# Patient Record
Sex: Male | Born: 1960 | Race: White | Hispanic: No | Marital: Single | Smoking: Former smoker
Health system: Southern US, Community
[De-identification: ages and names within clinical notes are randomized; demographics above are authoritative.]

## PROBLEM LIST (undated history)

## (undated) DIAGNOSIS — Z1589 Genetic susceptibility to other disease: Secondary | ICD-10-CM

## (undated) HISTORY — DX: Genetic susceptibility to other disease: Z15.89

---

## 2018-09-02 DIAGNOSIS — F259 Schizoaffective disorder, unspecified: Secondary | ICD-10-CM | POA: Insufficient documentation

## 2018-09-02 DIAGNOSIS — E785 Hyperlipidemia, unspecified: Secondary | ICD-10-CM | POA: Insufficient documentation

## 2018-09-02 DIAGNOSIS — E559 Vitamin D deficiency, unspecified: Secondary | ICD-10-CM | POA: Insufficient documentation

## 2018-09-02 DIAGNOSIS — E669 Obesity, unspecified: Secondary | ICD-10-CM | POA: Insufficient documentation

## 2020-07-27 ENCOUNTER — Encounter: Payer: Self-pay | Admitting: Gastroenterology

## 2020-08-03 ENCOUNTER — Encounter: Payer: Self-pay | Admitting: Family Medicine

## 2020-09-22 ENCOUNTER — Other Ambulatory Visit: Payer: Self-pay

## 2020-09-22 ENCOUNTER — Emergency Department: Payer: Medicare Other

## 2020-09-22 ENCOUNTER — Emergency Department
Admission: EM | Admit: 2020-09-22 | Discharge: 2020-09-22 | Disposition: A | Discharge status: Home / Self Care | Payer: Medicare Other | Attending: Emergency Medicine | Admitting: Emergency Medicine

## 2020-09-22 DIAGNOSIS — F419 Anxiety disorder, unspecified: Secondary | ICD-10-CM | POA: Diagnosis not present

## 2020-09-22 DIAGNOSIS — R079 Chest pain, unspecified: Secondary | ICD-10-CM

## 2020-09-22 DIAGNOSIS — R0602 Shortness of breath: Secondary | ICD-10-CM | POA: Insufficient documentation

## 2020-09-22 DIAGNOSIS — R0789 Other chest pain: Secondary | ICD-10-CM | POA: Insufficient documentation

## 2020-09-22 LAB — CBC
HCT: 42.4 % (ref 39.0–52.0)
Hemoglobin: 14.4 g/dL (ref 13.0–17.0)
MCH: 29.5 pg (ref 26.0–34.0)
MCHC: 34 g/dL (ref 30.0–36.0)
MCV: 86.9 fL (ref 80.0–100.0)
Platelets: 233 10*3/uL (ref 150–400)
RBC: 4.88 MIL/uL (ref 4.22–5.81)
RDW: 13.2 % (ref 11.5–15.5)
WBC: 5.2 10*3/uL (ref 4.0–10.5)
nRBC: 0 % (ref 0.0–0.2)

## 2020-09-22 LAB — BASIC METABOLIC PANEL
Anion gap: 6 (ref 5–15)
BUN: 18 mg/dL (ref 6–20)
CO2: 29 mmol/L (ref 22–32)
Calcium: 9.2 mg/dL (ref 8.9–10.3)
Chloride: 103 mmol/L (ref 98–111)
Creatinine, Ser: 0.91 mg/dL (ref 0.61–1.24)
GFR, Estimated: >60 mL/min (ref >60)
Glucose, Bld: 108 mg/dL — ABNORMAL HIGH (ref 70–99)
Potassium: 4.2 mmol/L (ref 3.5–5.1)
Sodium: 138 mmol/L (ref 135–145)

## 2020-09-22 LAB — TROPONIN I (HIGH SENSITIVITY): Troponin I (High Sensitivity): 3 ng/L (ref <18)

## 2020-09-22 NOTE — ED Notes (Signed)
Called facility/group home. They will send someone to pick him up now.

## 2020-09-22 NOTE — ED Notes (Signed)
Pt has legal guardian. Unable to sign for himself.

## 2020-09-22 NOTE — ED Triage Notes (Signed)
Pt here with CP and SOB. Pt has hx of anxiety. 154/101,77,95% 121-cbg. Pt feels better upon arrival to ED. Pt states it's more pressure than pain. Pt states pain is in the center and radiates down left arm.

## 2020-09-22 NOTE — ED Provider Notes (Signed)
[facility] Emergency Department Provider Note  ____________________________________________   Event Date/Time   First MD Initiated Contact with Patient 09/22/20 1216     (approximate)  I have reviewed the triage vital signs and the nursing notes.   HISTORY  Chief Complaint Chest Pain   HPI Tyrone Little is a 60 y.o. male with reported history of obesity and anxiety who presents for assessment of some left-sided chest pressure rating to his left arm associate with some shortness of breath he states began around 9 AM..  Patient states he was feeling very anxious and thought it was likely panic attack but was unsure as to when to get his heart checked out.  Patient states he has had many panic attacks before but does not usually have chest pressure or shortness of breath with it.  He states his symptoms resolved prior to presentation to the emergency room.  He states he was just having a bad day and is not sure if he has any specific thing that precipitated a panic attack.  He denies any SI or HI.  He denies any fevers, chills, headache, earache, sore throat, nausea, vomiting, diarrhea, dysuria, rash or recent falls or injuries.  He denies tobacco abuse EtOH use or illicit drug use.  Denies any other acute concerns at this time.      No past medical history on file.  There are no problems to display for this patient.     Prior to Admission medications   Not on File    Allergies Patient has no allergy information on record.  No family history on file.  Social History    Review of Systems  Review of Systems  Constitutional:  Negative for chills and fever.  HENT:  Negative for sore throat.   Eyes:  Negative for pain.  Respiratory:  Positive for shortness of breath. Negative for cough and stridor.   Cardiovascular:  Positive for chest pain.  Gastrointestinal:  Negative for vomiting.  Skin:  Negative for rash.  Neurological:  Negative for seizures,  loss of consciousness and headaches.  Psychiatric/Behavioral:  Negative for suicidal ideas. The patient is nervous/anxious.   All other systems reviewed and are negative.    ____________________________________________   PHYSICAL EXAM:  VITAL SIGNS: ED Triage Vitals  Enc Vitals Group     BP 09/22/20 1211 139/87     Pulse Rate 09/22/20 1211 67     Resp 09/22/20 1211 18     Temp 09/22/20 1211 98.3 F (36.8 C)     Temp Source 09/22/20 1211 Oral     SpO2 09/22/20 1211 96 %     Weight 09/22/20 1209 (!) 344 lb (156 kg)     Height 09/22/20 1209 5\' 11"  (1.803 m)     Head Circumference --      Peak Flow --      Pain Score 09/22/20 1209 9     Pain Loc --      Pain Edu? --      Excl. in GC? --    Vitals:   09/22/20 1211 09/22/20 1315  BP: 139/87 (!) 130/97  Pulse: 67 77  Resp: 18 (!) 25  Temp: 98.3 F (36.8 C)   SpO2: 96% 96%   Physical Exam Vitals and nursing note reviewed.  Constitutional:      Appearance: He is well-developed. He is obese.  HENT:     Head: Normocephalic and atraumatic.     Right Ear: External ear normal.  Left Ear: External ear normal.     Nose: Nose normal.  Eyes:     Conjunctiva/sclera: Conjunctivae normal.  Cardiovascular:     Rate and Rhythm: Normal rate and regular rhythm.     Heart sounds: No murmur heard. Pulmonary:     Effort: Pulmonary effort is normal. No respiratory distress.     Breath sounds: Normal breath sounds.  Abdominal:     Palpations: Abdomen is soft.     Tenderness: There is no abdominal tenderness.  Musculoskeletal:     Cervical back: Neck supple.     Right lower leg: No edema.     Left lower leg: No edema.  Skin:    General: Skin is warm and dry.     Capillary Refill: Capillary refill takes less than 2 seconds.  Neurological:     Mental Status: He is alert and oriented to person, place, and time.  Psychiatric:        Mood and Affect: Mood normal.     ____________________________________________   LABS (all  labs ordered are listed, but only abnormal results are displayed)  Labs Reviewed  BASIC METABOLIC PANEL - Abnormal; Notable for the following components:      Result Value   Glucose, Bld 108 (*)    All other components within normal limits  CBC  TROPONIN I (HIGH SENSITIVITY)   ____________________________________________  EKG  Sinus rhythm with a ventricular rate of 67, normal axis, unremarkable intervals without evidence of clear acute ischemia or significant Arrhythmia. ____________________________________________  RADIOLOGY  ED MD interpretation:    Official radiology report(s): DG Chest 2 View  Result Date: 09/22/2020 CLINICAL DATA:  Pt here with CP and SOB. Pt has hx of anxiety. Pt feels better upon arrival to ED. Pt states it's more pressure than pain. Pt states pain is in the center and radiates down left arm. Pt states he "thinks he is a diabetic but doesn't do anything for it." EXAM: CHEST - 2 VIEW COMPARISON:  None. FINDINGS: Cardiac silhouette normal in size. Normal mediastinal and hilar contours. Linear opacity at the right lung base consistent with atelectasis. Lungs otherwise clear. No pleural effusion or pneumothorax. Skeletal structures are intact. IMPRESSION: No active cardiopulmonary disease. Electronically Signed   By: Amie Portland M.D.   On: 09/22/2020 13:04    ____________________________________________   PROCEDURES  Procedure(s) performed (including Critical Care):  .1-3 Lead EKG Interpretation  Date/Time: 09/22/2020 1:28 PM Performed by: Gilles Chiquito, MD Authorized by: Gilles Chiquito, MD     Interpretation: normal     ECG rate assessment: normal     Rhythm: sinus rhythm     Ectopy: none     Conduction: normal     ____________________________________________   INITIAL IMPRESSION / ASSESSMENT AND PLAN / ED COURSE      Patient presents with above-stated history exam for assessment of an episode of chest pressure associate with some  shortness of breath that occurred earlier this morning and resolved prior to presentation.  On arrival he is afebrile and hemodynamically stable.  Current differential includes ACS, arrhythmia, PE, pneumonia, thorax, anemia and metabolic derangements  Overall it is very low sufficient for PE given sudden onset of symptoms and resolution without any interventions.  Patient has no clear risk factors and is not tachycardic tachypneic or hypoxic.  Chest x-ray has no evidence of pneumonia or pneumothorax or other clear acute intrathoracic process.  ECG and non-elevated troponin obtained greater than 2 hours after symptom onset are  not consistent with ACS.  CBC and BMP are unremarkable for evidence of acute anemia or significant metabolic derangements.  Given stable vitals with otherwise reassuring exam work-up I think patient stable for close outpatient follow-up.  Discharged stable condition.  Strict return precautions advised discussed.   ____________________________________________   FINAL CLINICAL IMPRESSION(S) / ED DIAGNOSES  Final diagnoses:  Chest pain, unspecified type    Medications - No data to display   ED Discharge Orders     None        Note:  This document was prepared using Dragon voice recognition software and may include unintentional dictation errors.    Gilles Chiquito, MD 09/22/20 1329

## 2021-05-09 ENCOUNTER — Ambulatory Visit (INDEPENDENT_AMBULATORY_CARE_PROVIDER_SITE_OTHER): Payer: Medicare Other | Admitting: Nurse Practitioner

## 2021-05-09 ENCOUNTER — Encounter: Payer: Self-pay | Admitting: Nurse Practitioner

## 2021-05-09 ENCOUNTER — Other Ambulatory Visit: Payer: Self-pay

## 2021-05-09 VITALS — BP 130/80 | HR 90 | Temp 97.8°F | Resp 18 | Wt 237.9 lb

## 2021-05-09 DIAGNOSIS — Z7689 Persons encountering health services in other specified circumstances: Secondary | ICD-10-CM

## 2021-05-09 DIAGNOSIS — Z13220 Encounter for screening for lipoid disorders: Secondary | ICD-10-CM | POA: Diagnosis not present

## 2021-05-09 DIAGNOSIS — E559 Vitamin D deficiency, unspecified: Secondary | ICD-10-CM | POA: Diagnosis not present

## 2021-05-09 DIAGNOSIS — F259 Schizoaffective disorder, unspecified: Secondary | ICD-10-CM

## 2021-05-09 DIAGNOSIS — Z130 Encounter for screening for diseases of the blood and blood-forming organs and certain disorders involving the immune mechanism: Secondary | ICD-10-CM

## 2021-05-09 DIAGNOSIS — Z79899 Other long term (current) drug therapy: Secondary | ICD-10-CM

## 2021-05-09 DIAGNOSIS — E785 Hyperlipidemia, unspecified: Secondary | ICD-10-CM | POA: Diagnosis not present

## 2021-05-09 DIAGNOSIS — Z0000 Encounter for general adult medical examination without abnormal findings: Secondary | ICD-10-CM

## 2021-05-09 NOTE — Progress Notes (Deleted)
Name: Tyrone Little   MRN: [id number]    DOB: [birth year]   Date:05/09/2021       Progress Note  Subjective  Chief Complaint  Chief Complaint  Patient presents with   Establish Care    HPI  Patient presents for annual CPE and to establish care  Establish care: Last physical was a year ago, no abnormal findings per staff of assisted living facility. He does have history of schizoaffective disorder, development disorder and hyperlipidemia. He currently gets mental health care at [facility] in [place].   Schizoaffective disorder: Currently taking risperidone 2 mg BID, Seroquel 200 mg at bed time, hydroxyzine 50 mg TID PRN, and fluphenazine 5 mg at bedtime. He says he has been doing well and staff agrees.   Hyperlipidemia: He is currently taking simvastatin 40 mg at bedtime.  He denies any myalgia.  I have no results from last lipid panel will get labs today.   Vitamin D deficiency: He is currently taking vitamin d supplement. I have no records of what his last vitamin d was will get labs today.   IPSS Questionnaire (AUA-7): Over the past month   1)  How often have you had a sensation of not emptying your bladder completely after you finish urinating?  0 - Not at all  2)  How often have you had to urinate again less than two hours after you finished urinating? 0 - Not at all  3)  How often have you found you stopped and started again several times when you urinated?  0 - Not at all  4) How difficult have you found it to postpone urination?  0 - Not at all  5) How often have you had a weak urinary stream?  0 - Not at all  6) How often have you had to push or strain to begin urination?  0 - Not at all  7) How many times did you most typically get up to urinate from the time you went to bed until the time you got up in the morning?  0 - None  Total score:  0-7 mildly symptomatic   8-19 moderately symptomatic   20-35 severely symptomatic     Diet: well balanced Exercise: walks  everyday for thirty minutes  Depression: phq 9 is negative Depression screen [facility] 2/9 05/09/2021 05/09/2021  Decreased Interest 0 0  Down, Depressed, Hopeless 0 0  PHQ - 2 Score 0 0  Altered sleeping 0 -  Tired, decreased energy 0 -  Change in appetite 0 -  Feeling bad or failure about yourself  0 -  Trouble concentrating 0 -  Moving slowly or fidgety/restless 0 -  Suicidal thoughts 0 -  PHQ-9 Score 0 -  Difficult doing work/chores Not difficult at all -     Hypertension:  BP Readings from Last 3 Encounters:  05/09/21 130/80  09/22/20 (!) 148/96    Obesity: Wt Readings from Last 3 Encounters:  05/09/21 237 lb 14.4 oz (107.9 kg)  09/22/20 (!) 344 lb (156 kg)   BMI Readings from Last 3 Encounters:  05/09/21 33.18 kg/m  09/22/20 47.98 kg/m     Lipids:  No results found for: CHOL No results found for: HDL No results found for: LDLCALC No results found for: TRIG No results found for: CHOLHDL No results found for: LDLDIRECT Glucose:  Glucose, Bld  Date Value Ref Range Status  09/22/2020 108 (H) 70 - 99 mg/dL Final    Comment:  Glucose reference range applies only to samples taken after fasting for at least 8 hours.    Flowsheet Row [facility]  AUDIT-C Score 1        Single STD testing and prevention (HIV/chl/gon/syphilis): declined Hep C: declined  Skin cancer: Discussed monitoring for atypical lesions Colorectal cancer: He has an appointment with GI in the next few weeks. He was recently seen in emergency room for incontinence of stool.  They referred him to GI.  Prostate cancer: no concerns or symptoms No results found for: PSA   Lung cancer:   Low Dose CT Chest recommended if Age 4-80 years, 30 pack-year currently smoking OR have quit w/in 15years. Patient does not qualify.   AAA:  The USPSTF recommends one-time screening with ultrasonography in men ages 67 to 79 years who have ever smoked ECG:   09/22/2020  Vaccines:  HPV: up to at age 46 , ask insurance if age between 88-45  Shingrix: 35-59 yo and ask insurance if covered when patient above 5 yo Pneumonia:  educated and discussed with patient. Flu:  educated and discussed with patient.  Advanced Care Planning: A voluntary discussion about advance care planning including the explanation and discussion of advance directives.  Discussed health care proxy and Living will, and the patient was able to identify a health care proxy as brother.  Patient does not have a living will at present time. If patient does have living will, I have requested they bring this to the clinic to be scanned in to their chart.  Patient Active Problem List   Diagnosis Date Noted   Hyperlipidemia 09/02/2018   Obesity 09/02/2018   Schizoaffective disorder (HCC) 09/02/2018   Vitamin D deficiency 09/02/2018    No family history on file.  Social History   Socioeconomic History   Marital status: Single    Spouse name: Not on file   Number of children: Not on file   Years of education: Not on file   Highest education level: Not on file  Occupational History   Not on file  Tobacco Use   Smoking status: Former    Types: Cigarettes    Quit date: 2007    Years since quitting: 16.1   Smokeless tobacco: Never  Vaping Use   Vaping Use: Never used  Substance and Sexual Activity   Alcohol use: Never   Drug use: Never   Sexual activity: Never  Other Topics Concern   Not on file  Social History Narrative   [facility] [address], [place], [place]  [phone]   Social Determinants of Health   Financial Resource Strain: Low Risk    Difficulty of Paying Living Expenses: Not hard at all  Food Insecurity: No Food Insecurity   Worried About Programme researcher, broadcasting/film/video in the Last Year: Never true   Ran Out of Food in the Last Year: Never true  Transportation Needs: No Transportation Needs   Lack of Transportation (Medical): No   Lack of  Transportation (Non-Medical): No  Physical Activity: Sufficiently Active   Days of Exercise per Week: 7 days   Minutes of Exercise per Session: 30 min  Stress: No Stress Concern Present   Feeling of Stress : Not at all  Social Connections: Moderately Integrated   Frequency of Communication with Friends and Family: More than three times a week   Frequency of Social Gatherings with Friends and Family: Not on file   Attends Religious  Services: More than 4 times per year   Active Member of Clubs or Organizations: Yes   Attends BankerClub or Organization Meetings: Not on file   Marital Status: Never married  Intimate Partner Violence: Not At Risk   Fear of Current or Ex-Partner: No   Emotionally Abused: No   Physically Abused: No   Sexually Abused: No     Current Outpatient Medications:    acetaminophen (TYLENOL) 500 MG tablet, Take 500 mg by mouth every 6 (six) hours as needed., Disp: , Rfl:    cetirizine (ZYRTEC) 10 MG tablet, Take by mouth., Disp: , Rfl:    Cholecalciferol (VITAMIN D3) 1.25 MG (50000 UT) CAPS, Take by mouth., Disp: , Rfl:    fluPHENAZine (PROLIXIN) 5 MG tablet, Take 5 mg by mouth at bedtime., Disp: , Rfl:    hydrOXYzine (ATARAX) 50 MG tablet, Take 50 mg by mouth 3 (three) times daily., Disp: , Rfl:    ibuprofen (ADVIL) 600 MG tablet, Take 600 mg by mouth every 6 (six) hours as needed., Disp: , Rfl:    Multiple Vitamin (MULTI-VITAMIN) tablet, Take 1 tablet by mouth daily., Disp: , Rfl:    omega-3 acid ethyl esters (LOVAZA) 1 g capsule, Take by mouth 2 (two) times daily., Disp: , Rfl:    pioglitazone (ACTOS) 30 MG tablet, Take 30 mg by mouth daily., Disp: , Rfl:    QUEtiapine (SEROQUEL) 200 MG tablet, Take 200 mg by mouth at bedtime., Disp: , Rfl:    risperiDONE (RISPERDAL) 2 MG tablet, Take 2 mg by mouth 2 (two) times daily., Disp: , Rfl:    simvastatin (ZOCOR) 40 MG tablet, Take 40 mg by mouth at bedtime., Disp: , Rfl:    tamsulosin (FLOMAX) 0.4 MG CAPS capsule, Take 0.4 mg  by mouth daily., Disp: , Rfl:   Not on File   ROS  Constitutional: Negative for fever or weight change.  Respiratory: Negative for cough and shortness of breath.   Cardiovascular: Negative for chest pain or palpitations.  Gastrointestinal: Negative for abdominal pain, has been incontinent of stool.  Musculoskeletal: Negative for gait problem or joint swelling.  Skin: Negative for rash.  Neurological: Negative for dizziness or headache.  No other specific complaints in a complete review of systems (except as listed in HPI above).    Objective  Vitals:   05/09/21 1050  BP: 130/80  Pulse: 90  Resp: 18  Temp: 97.8 F (36.6 C)  TempSrc: Oral  SpO2: 96%  Weight: 237 lb 14.4 oz (107.9 kg)    Body mass index is 33.18 kg/m.  Physical Exam  Constitutional: Patient appears well-developed and well-nourished. No distress.  HENT: Head: Normocephalic and atraumatic. Ears: B TMs ok, hearing normal (whisper test) no erythema or effusion; Nose: Nose normal. Mouth/Throat: not done Eyes: Conjunctivae and EOM are normal. Pupils are equal, round, and reactive to light. No scleral icterus.  Neck: Normal range of motion. Neck supple. No JVD present. No thyromegaly present.  Cardiovascular: Normal rate, regular rhythm and normal heart sounds.  No murmur heard. No BLE edema. Pulmonary/Chest: Effort normal and breath sounds normal. No respiratory distress. Abdominal: Soft. Bowel sounds are normal, no distension. Tenderness all over. no masses Musculoskeletal: Normal range of motion, no joint effusions. No gross deformities Neurological: he is alert and oriented to person, place, and time. No cranial nerve deficit. Coordination, balance, strength, speech and gait are normal.  Skin: Skin is warm and dry. No rash noted. No erythema.  Psychiatric: Patient has a  normal mood and affect. behavior is normal. Judgment and thought content normal.   No results found for this or any previous visit (from the  past 2160 hour(s)).   Fall Risk: Fall Risk  05/09/2021  Falls in the past year? 1  Number falls in past yr: 0  Injury with Fall? 1  Risk for fall due to : History of fall(s)  Follow up Falls evaluation completed     Functional Status Survey: Is the patient deaf or have difficulty hearing?: No Does the patient have difficulty seeing, even when wearing glasses/contacts?: No Does the patient have difficulty concentrating, remembering, or making decisions?: Yes Does the patient have difficulty walking or climbing stairs?: Yes Does the patient have difficulty dressing or bathing?: No Does the patient have difficulty doing errands alone such as visiting a doctor's office or shopping?: Yes    Assessment & Plan  1. Annual physical exam -continue to stay physically active -has appointment with GI to discuss bowel changes, he is due for colonoscopy - Lipid panel - CBC with Differential/Platelet - COMPLETE METABOLIC PANEL WITH GFR - VITAMIN D 25 Hydroxy (Vit-D Deficiency, Fractures)  2. Screening for cholesterol level  - Lipid panel  3. Vitamin D deficiency  - VITAMIN D 25 Hydroxy (Vit-D Deficiency, Fractures)  4. Hyperlipidemia, unspecified hyperlipidemia type  - Lipid panel - COMPLETE METABOLIC PANEL WITH GFR  5. Schizoaffective disorder, unspecified type (HCC) -continue care at mental health facility  6. Screening for deficiency anemia  - CBC with Differential/Platelet  7. High risk medication use  - CBC with Differential/Platelet - COMPLETE METABOLIC PANEL WITH GFR  8. Encounter to establish care -cpe in one year -get records from previous provider   -Prostate cancer screening and PSA options (with potential risks and benefits of testing vs not testing) were discussed along with recent recs/guidelines. -USPSTF grade A and B recommendations reviewed with patient; age-appropriate recommendations, preventive care, screening tests, etc discussed and encouraged;  healthy living encouraged; see AVS for patient education given to patient -Discussed importance of 150 minutes of physical activity weekly, eat two servings of fish weekly, eat one serving of tree nuts ( cashews, pistachios, pecans, almonds.Marland Kitchen) every other day, eat 6 servings of fruit/vegetables daily and drink plenty of water and avoid sweet beverages.

## 2021-05-10 LAB — COMPLETE METABOLIC PANEL WITH GFR
AG Ratio: 1.8 (calc) (ref 1.0–2.5)
ALT: 7 U/L — ABNORMAL LOW (ref 9–46)
AST: 11 U/L (ref 10–35)
Albumin: 4.2 g/dL (ref 3.6–5.1)
Alkaline phosphatase (APISO): 60 U/L (ref 35–144)
BUN: 21 mg/dL (ref 7–25)
CO2: 29 mmol/L (ref 20–32)
Calcium: 9.6 mg/dL (ref 8.6–10.3)
Chloride: 104 mmol/L (ref 98–110)
Creat: 1.07 mg/dL (ref 0.70–1.30)
Globulin: 2.3 g/dL (calc) (ref 1.9–3.7)
Glucose, Bld: 88 mg/dL (ref 65–99)
Potassium: 4.5 mmol/L (ref 3.5–5.3)
Sodium: 139 mmol/L (ref 135–146)
Total Bilirubin: 0.6 mg/dL (ref 0.2–1.2)
Total Protein: 6.5 g/dL (ref 6.1–8.1)
eGFR: 80 mL/min/{1.73_m2} (ref >=60)

## 2021-05-10 LAB — CBC WITH DIFFERENTIAL/PLATELET
Absolute Monocytes: 338 cells/uL (ref 200–950)
Basophils Absolute: 31 cells/uL (ref 0–200)
Basophils Relative: 0.6 %
Eosinophils Absolute: 62 cells/uL (ref 15–500)
Eosinophils Relative: 1.2 %
HCT: 43.4 % (ref 38.5–50.0)
Hemoglobin: 14.6 g/dL (ref 13.2–17.1)
Lymphs Abs: 1368 cells/uL (ref 850–3900)
MCH: 29.4 pg (ref 27.0–33.0)
MCHC: 33.6 g/dL (ref 32.0–36.0)
MCV: 87.3 fL (ref 80.0–100.0)
MPV: 10.2 fL (ref 7.5–12.5)
Monocytes Relative: 6.5 %
Neutro Abs: 3401 cells/uL (ref 1500–7800)
Neutrophils Relative %: 65.4 %
Platelets: 245 10*3/uL (ref 140–400)
RBC: 4.97 10*6/uL (ref 4.20–5.80)
RDW: 12.2 % (ref 11.0–15.0)
Total Lymphocyte: 26.3 %
WBC: 5.2 10*3/uL (ref 3.8–10.8)

## 2021-05-10 LAB — LIPID PANEL
Cholesterol: 125 mg/dL (ref <200)
HDL: 38 mg/dL — ABNORMAL LOW (ref >=40)
LDL Cholesterol (Calc): 70 mg/dL (calc)
Non-HDL Cholesterol (Calc): 87 mg/dL (calc) (ref <130)
Total CHOL/HDL Ratio: 3.3 (calc) (ref <5.0)
Triglycerides: 88 mg/dL (ref <150)

## 2021-05-10 LAB — VITAMIN D 25 HYDROXY (VIT D DEFICIENCY, FRACTURES): Vit D, 25-Hydroxy: 79 ng/mL (ref 30–100)

## 2021-05-10 NOTE — Addendum Note (Signed)
Addended by: Serafina Royals F on: 05/10/2021 08:08 AM   Modules accepted: Level of Service

## 2021-05-10 NOTE — Progress Notes (Addendum)
Name: Tyrone Little   MRN: [id number]    DOB: 1960/12/09   Date:05/10/2021       Progress Note  Subjective  Chief Complaint  Chief Complaint  Patient presents with   Establish Care    HPI  Patient presents to establish care  Establish care: Last physical was a year ago, no abnormal findings per staff of assisted living facility. He does have history of schizoaffective disorder, development disorder and hyperlipidemia. He currently gets mental health care at [facility] in [place].   Schizoaffective disorder: Currently taking risperidone 2 mg BID, Seroquel 200 mg at bed time, hydroxyzine 50 mg TID PRN, and fluphenazine 5 mg at bedtime. He says he has been doing well and staff agrees.   Hyperlipidemia: He is currently taking simvastatin 40 mg at bedtime.  He denies any myalgia.  I have no results from last lipid panel will get labs today.   Vitamin D deficiency: He is currently taking vitamin d supplement. I have no records of what his last vitamin d was will get labs today.   IPSS Questionnaire (AUA-7): Over the past month   1)  How often have you had a sensation of not emptying your bladder completely after you finish urinating?  0 - Not at all  2)  How often have you had to urinate again less than two hours after you finished urinating? 0 - Not at all  3)  How often have you found you stopped and started again several times when you urinated?  0 - Not at all  4) How difficult have you found it to postpone urination?  0 - Not at all  5) How often have you had a weak urinary stream?  0 - Not at all  6) How often have you had to push or strain to begin urination?  0 - Not at all  7) How many times did you most typically get up to urinate from the time you went to bed until the time you got up in the morning?  0 - None  Total score:  0-7 mildly symptomatic   8-19 moderately symptomatic   20-35 severely symptomatic     Diet: well balanced Exercise: walks everyday for thirty  minutes  Depression: phq 9 is negative Depression screen [facility] 2/9 05/09/2021 05/09/2021  Decreased Interest 0 0  Down, Depressed, Hopeless 0 0  PHQ - 2 Score 0 0  Altered sleeping 0 -  Tired, decreased energy 0 -  Change in appetite 0 -  Feeling bad or failure about yourself  0 -  Trouble concentrating 0 -  Moving slowly or fidgety/restless 0 -  Suicidal thoughts 0 -  PHQ-9 Score 0 -  Difficult doing work/chores Not difficult at all -     Hypertension:  BP Readings from Last 3 Encounters:  05/09/21 130/80  09/22/20 (!) 148/96    Obesity: Wt Readings from Last 3 Encounters:  05/09/21 237 lb 14.4 oz (107.9 kg)  09/22/20 (!) 344 lb (156 kg)   BMI Readings from Last 3 Encounters:  05/09/21 33.18 kg/m  09/22/20 47.98 kg/m     Lipids:  Lab Results  Component Value Date   CHOL 125 05/09/2021   Lab Results  Component Value Date   HDL 38 (L) 05/09/2021   Lab Results  Component Value Date   LDLCALC 70 05/09/2021   Lab Results  Component Value Date   TRIG 88 05/09/2021   Lab Results  Component Value Date  CHOLHDL 3.3 05/09/2021   No results found for: LDLDIRECT Glucose:  Glucose, Bld  Date Value Ref Range Status  05/09/2021 88 65 - 99 mg/dL Final    Comment:    .            Fasting reference interval .   09/22/2020 108 (H) 70 - 99 mg/dL Final    Comment:    Glucose reference range applies only to samples taken after fasting for at least 8 hours.    [facility] Visit from 05/09/2021 in [facility]  AUDIT-C Score 1        Single STD testing and prevention (HIV/chl/gon/syphilis): declined Hep C: declined  Skin cancer: Discussed monitoring for atypical lesions Colorectal cancer: He has an appointment with GI in the next few weeks. He was recently seen in emergency room for incontinence of stool.  They referred him to GI.  Prostate cancer: no concerns or symptoms No results found for: PSA   Lung cancer:   Low Dose CT  Chest recommended if Age 58-80 years, 30 pack-year currently smoking OR have quit w/in 15years. Patient does not qualify.   AAA:  The USPSTF recommends one-time screening with ultrasonography in men ages 37 to 60 years who have ever smoked ECG:  09/22/2020  Vaccines:  HPV: up to at age 52 , ask insurance if age between 23-45  Shingrix: 53-64 yo and ask insurance if covered when patient above 15 yo Pneumonia:  educated and discussed with patient. Flu:  educated and discussed with patient.  Advanced Care Planning: A voluntary discussion about advance care planning including the explanation and discussion of advance directives.  Discussed health care proxy and Living will, and the patient was able to identify a health care proxy as brother.  Patient does not have a living will at present time. If patient does have living will, I have requested they bring this to the clinic to be scanned in to their chart.  Patient Active Problem List   Diagnosis Date Noted   Hyperlipidemia 09/02/2018   Obesity 09/02/2018   Schizoaffective disorder ([place]) 09/02/2018   Vitamin D deficiency 09/02/2018    No family history on file.  Social History   Socioeconomic History   Marital status: Single    Spouse name: Not on file   Number of children: Not on file   Years of education: Not on file   Highest education level: Not on file  Occupational History   Not on file  Tobacco Use   Smoking status: Former    Types: Cigarettes    Quit date: 2007    Years since quitting: 16.1   Smokeless tobacco: Never  Vaping Use   Vaping Use: Never used  Substance and Sexual Activity   Alcohol use: Never   Drug use: Never   Sexual activity: Never  Other Topics Concern   Not on file  Social History Narrative   [place], [place], [place]   Social Determinants of Health   Financial Resource Strain: Low Risk    Difficulty of Paying Living Expenses: Not hard at all  Food  Insecurity: No Food Insecurity   Worried About Charity fundraiser in the Last Year: Never true   [employer] in the Last Year: Never true  Transportation Needs: No Transportation Needs   Lack of Transportation (Medical): No   Lack of Transportation (Non-Medical): No  Physical Activity: Sufficiently Active  Days of Exercise per Week: 7 days   Minutes of Exercise per Session: 30 min  Stress: No Stress Concern Present   Feeling of Stress : Not at all  Social Connections: Moderately Integrated   Frequency of Communication with Friends and Family: More than three times a week   Frequency of Social Gatherings with Friends and Family: Not on file   Attends Religious Services: More than 4 times per year   Active Member of [employer] or Organizations: Yes   Attends Archivist Meetings: Not on file   Marital Status: Never married  Intimate Partner Violence: Not At Risk   Fear of Current or Ex-Partner: No   Emotionally Abused: No   Physically Abused: No   Sexually Abused: No     Current Outpatient Medications:    acetaminophen (TYLENOL) 500 MG tablet, Take 500 mg by mouth every 6 (six) hours as needed., Disp: , Rfl:    cetirizine (ZYRTEC) 10 MG tablet, Take by mouth., Disp: , Rfl:    Cholecalciferol (VITAMIN D3) 1.25 MG (50000 UT) CAPS, Take by mouth., Disp: , Rfl:    fluPHENAZine (PROLIXIN) 5 MG tablet, Take 5 mg by mouth at bedtime., Disp: , Rfl:    hydrOXYzine (ATARAX) 50 MG tablet, Take 50 mg by mouth 3 (three) times daily., Disp: , Rfl:    ibuprofen (ADVIL) 600 MG tablet, Take 600 mg by mouth every 6 (six) hours as needed., Disp: , Rfl:    Multiple Vitamin (MULTI-VITAMIN) tablet, Take 1 tablet by mouth daily., Disp: , Rfl:    omega-3 acid ethyl esters (LOVAZA) 1 g capsule, Take by mouth 2 (two) times daily., Disp: , Rfl:    pioglitazone (ACTOS) 30 MG tablet, Take 30 mg by mouth daily., Disp: , Rfl:    QUEtiapine (SEROQUEL) 200 MG tablet, Take 200 mg by mouth at bedtime.,  Disp: , Rfl:    risperiDONE (RISPERDAL) 2 MG tablet, Take 2 mg by mouth 2 (two) times daily., Disp: , Rfl:    simvastatin (ZOCOR) 40 MG tablet, Take 40 mg by mouth at bedtime., Disp: , Rfl:    tamsulosin (FLOMAX) 0.4 MG CAPS capsule, Take 0.4 mg by mouth daily., Disp: , Rfl:   Not on File   ROS  Constitutional: Negative for fever or weight change.  Respiratory: Negative for cough and shortness of breath.   Cardiovascular: Negative for chest pain or palpitations.  Gastrointestinal: Negative for abdominal pain, has been incontinent of stool.  Musculoskeletal: Negative for gait problem or joint swelling.  Skin: Negative for rash.  Neurological: Negative for dizziness or headache.  No other specific complaints in a complete review of systems (except as listed in HPI above).    Objective  Vitals:   05/09/21 1050  BP: 130/80  Pulse: 90  Resp: 18  Temp: 97.8 F (36.6 C)  TempSrc: Oral  SpO2: 96%  Weight: 237 lb 14.4 oz (107.9 kg)    Body mass index is 33.18 kg/m.  Physical Exam  Constitutional: Patient appears well-developed and well-nourished. No distress.  HENT: Head: Normocephalic and atraumatic. Ears: B TMs ok, hearing normal (whisper test) no erythema or effusion; Nose: Nose normal. Mouth/Throat: not done Eyes: Conjunctivae and EOM are normal. Pupils are equal, round, and reactive to light. No scleral icterus.  Neck: Normal range of motion. Neck supple. No JVD present. No thyromegaly present.  Cardiovascular: Normal rate, regular rhythm and normal heart sounds.  No murmur heard. No BLE edema. Pulmonary/Chest: Effort normal and breath sounds  normal. No respiratory distress. Abdominal: Soft. Bowel sounds are normal, no distension. Tenderness all over. no masses Musculoskeletal: Normal range of motion, no joint effusions. No gross deformities Neurological: he is alert and oriented to person, place, and time. No cranial nerve deficit. Coordination, balance, strength, speech  and gait are normal.  Skin: Skin is warm and dry. No rash noted. No erythema.  Psychiatric: Patient has a normal mood and affect. behavior is normal. Judgment and thought content normal.   Recent Results (from the past 2160 hour(s))  Lipid panel     Status: Abnormal   Collection Time: 05/09/21 11:23 AM  Result Value Ref Range   Cholesterol 125 <200 mg/dL   HDL 38 (L) > OR = 40 mg/dL   Triglycerides 88 <150 mg/dL   LDL Cholesterol (Calc) 70 mg/dL (calc)    Comment: Reference range: <100 . Desirable range <100 mg/dL for primary prevention;   <70 mg/dL for patients with CHD or diabetic patients  with > or = 2 CHD risk factors. Marland Kitchen LDL-C is now calculated using the Martin-Hopkins  calculation, which is a validated novel method providing  better accuracy than the Friedewald equation in the  estimation of LDL-C.  Cresenciano Genre et al. Annamaria Helling. 4970;263(78): 2061-2068  (http://education.[facility].com/faq/FAQ164)    Total CHOL/HDL Ratio 3.3 <5.0 (calc)   Non-HDL Cholesterol (Calc) 87 <130 mg/dL (calc)    Comment: For patients with diabetes plus 1 major ASCVD risk  factor, treating to a non-HDL-C goal of <100 mg/dL  (LDL-C of <70 mg/dL) is considered a therapeutic  option.   CBC with Differential/Platelet     Status: None   Collection Time: 05/09/21 11:23 AM  Result Value Ref Range   WBC 5.2 3.8 - 10.8 Thousand/uL   RBC 4.97 4.20 - 5.80 Million/uL   Hemoglobin 14.6 13.2 - 17.1 g/dL   HCT 43.4 38.5 - 50.0 %   MCV 87.3 80.0 - 100.0 fL   MCH 29.4 27.0 - 33.0 pg   MCHC 33.6 32.0 - 36.0 g/dL   RDW 12.2 11.0 - 15.0 %   Platelets 245 140 - 400 Thousand/uL   MPV 10.2 7.5 - 12.5 fL   Neutro Abs 3,401 1,500 - 7,800 cells/uL   Lymphs Abs 1,368 850 - 3,900 cells/uL   Absolute Monocytes 338 200 - 950 cells/uL   Eosinophils Absolute 62 15 - 500 cells/uL   Basophils Absolute 31 0 - 200 cells/uL   Neutrophils Relative % 65.4 %   Total Lymphocyte 26.3 %   Monocytes Relative 6.5 %    Eosinophils Relative 1.2 %   Basophils Relative 0.6 %  COMPLETE METABOLIC PANEL WITH GFR     Status: Abnormal   Collection Time: 05/09/21 11:23 AM  Result Value Ref Range   Glucose, Bld 88 65 - 99 mg/dL    Comment: .            Fasting reference interval .    BUN 21 7 - 25 mg/dL   Creat 1.07 0.70 - 1.30 mg/dL   eGFR 80 > OR = 60 mL/min/1.76m    Comment: The eGFR is based on the CKD-EPI 2021 equation. To calculate  the new eGFR from a previous Creatinine or Cystatin C result, go to https://www.kidney.org/professionals/ kdoqi/gfr%5Fcalculator    BUN/Creatinine Ratio NOT APPLICABLE 6 - 22 (calc)   Sodium 139 135 - 146 mmol/L   Potassium 4.5 3.5 - 5.3 mmol/L   Chloride 104 98 - 110 mmol/L   CO2 29 20 - 32  mmol/L   Calcium 9.6 8.6 - 10.3 mg/dL   Total Protein 6.5 6.1 - 8.1 g/dL   Albumin 4.2 3.6 - 5.1 g/dL   Globulin 2.3 1.9 - 3.7 g/dL (calc)   AG Ratio 1.8 1.0 - 2.5 (calc)   Total Bilirubin 0.6 0.2 - 1.2 mg/dL   Alkaline phosphatase (APISO) 60 35 - 144 U/L   AST 11 10 - 35 U/L   ALT 7 (L) 9 - 46 U/L  VITAMIN D 25 Hydroxy (Vit-D Deficiency, Fractures)     Status: None   Collection Time: 05/09/21 11:23 AM  Result Value Ref Range   Vit D, 25-Hydroxy 79 30 - 100 ng/mL    Comment: Vitamin D Status         25-OH Vitamin D: . Deficiency:                    <20 ng/mL Insufficiency:             20 - 29 ng/mL Optimal:                 > or = 30 ng/mL . For 25-OH Vitamin D testing on patients on  D2-supplementation and patients for whom quantitation  of D2 and D3 fractions is required, the [facility]ureD(TM) 25-OH VIT D, (D2,D3), LC/MS/MS is recommended: order  code [phone] (patients >73yr). See Note 1 . Note 1 . For additional information, please refer to  http://education.[facility].com/faq/FAQ199  (This link is being provided for informational/ educational purposes only.)      Fall Risk: Fall Risk  05/09/2021  Falls in the past year? 1  Number falls in past yr: 0   Injury with Fall? 1  Risk for fall due to : History of fall(s)  Follow up Falls evaluation completed     Functional Status Survey: Is the patient deaf or have difficulty hearing?: No Does the patient have difficulty seeing, even when wearing glasses/contacts?: No Does the patient have difficulty concentrating, remembering, or making decisions?: Yes Does the patient have difficulty walking or climbing stairs?: Yes Does the patient have difficulty dressing or bathing?: No Does the patient have difficulty doing errands alone such as visiting a doctor's office or shopping?: Yes    Assessment & Plan  Schizoaffective disorder, unspecified type ([place] -continue care at mental health facility   2. Screening for cholesterol level  - Lipid panel  3. Vitamin D deficiency  - VITAMIN D 25 Hydroxy (Vit-D Deficiency, Fractures)  4. Hyperlipidemia, unspecified hyperlipidemia type  - Lipid panel - COMPLETE METABOLIC PANEL WITH GFR   5. Screening for deficiency anemia  - CBC with Differential/Platelet  6. High risk medication use  - CBC with Differential/Platelet - COMPLETE METABOLIC PANEL WITH GFR  7. Encounter to establish care -get records from previous provider   -Prostate cancer screening and PSA options (with potential risks and benefits of testing vs not testing) were discussed along with recent recs/guidelines. -USPSTF grade A and B recommendations reviewed with patient; age-appropriate recommendations, preventive care, screening tests, etc discussed and encouraged; healthy living encouraged; see AVS for patient education given to patient -Discussed importance of 150 minutes of physical activity weekly, eat two servings of fish weekly, eat one serving of tree nuts ( cashews, pistachios, pecans, almonds..Marland Kitchen every other day, eat 6 servings of fruit/vegetables daily and drink plenty of water and avoid sweet beverages.

## 2021-06-04 ENCOUNTER — Other Ambulatory Visit: Payer: Self-pay | Admitting: Family Medicine

## 2021-06-22 ENCOUNTER — Telehealth: Payer: Self-pay | Admitting: Nurse Practitioner

## 2021-06-22 NOTE — Telephone Encounter (Signed)
Copied from CRM [id number]. Topic: Medicare AWV ?>> Jun 22, 2021 11:50 AM Claudette Laws R wrote: ?Reason for CRM:  ?Left message for patient to call back and schedule Medicare Annual Wellness Visit (AWV) in office.  ? ?If unable to come into the office for AWV,  please offer to do virtually or by telephone. ? ?Last AWV: 01/31/2019 awvs per [place] ? ?Please schedule at anytime with [facility] Health Advisor. ? ?30 minute appointment for Virtual or phone ?45 minute appointment for in office or Initial virtual/phone ? ?Any questions, please contact me at [phone] ?

## 2021-07-10 ENCOUNTER — Telehealth: Payer: Self-pay | Admitting: Emergency Medicine

## 2021-07-10 NOTE — Telephone Encounter (Signed)
Spoke to Care home and also [facility]. Care home will discuss with GI about scheduling appointment. This has not been ordered by GI. Patient is seeing [facility] [employer] at [facility].  ?

## 2021-08-01 ENCOUNTER — Emergency Department: Payer: Medicare Other

## 2021-08-01 ENCOUNTER — Encounter: Payer: Self-pay | Admitting: Emergency Medicine

## 2021-08-01 ENCOUNTER — Inpatient Hospital Stay (HOSPITAL_COMMUNITY)
Admission: AD | Admit: 2021-08-01 | Discharge: 2021-08-06 | DRG: 100 | Disposition: A | Discharge status: Home Health | Payer: Medicare Other | Source: Other Acute Inpatient Hospital | Attending: Family Medicine | Admitting: Family Medicine

## 2021-08-01 ENCOUNTER — Inpatient Hospital Stay (HOSPITAL_COMMUNITY): Payer: Medicare Other

## 2021-08-01 ENCOUNTER — Emergency Department
Admission: EM | Admit: 2021-08-01 | Discharge: 2021-08-01 | Disposition: A | Discharge status: Other Acute Inpatient Hospital | Payer: Medicare Other | Attending: Emergency Medicine | Admitting: Emergency Medicine

## 2021-08-01 DIAGNOSIS — Z781 Physical restraint status: Secondary | ICD-10-CM | POA: Diagnosis not present

## 2021-08-01 DIAGNOSIS — J690 Pneumonitis due to inhalation of food and vomit: Secondary | ICD-10-CM | POA: Diagnosis present

## 2021-08-01 DIAGNOSIS — G40901 Epilepsy, unspecified, not intractable, with status epilepticus: Secondary | ICD-10-CM | POA: Diagnosis not present

## 2021-08-01 DIAGNOSIS — R29810 Facial weakness: Secondary | ICD-10-CM | POA: Insufficient documentation

## 2021-08-01 DIAGNOSIS — G9340 Encephalopathy, unspecified: Secondary | ICD-10-CM

## 2021-08-01 DIAGNOSIS — E785 Hyperlipidemia, unspecified: Secondary | ICD-10-CM | POA: Diagnosis present

## 2021-08-01 DIAGNOSIS — R4182 Altered mental status, unspecified: Secondary | ICD-10-CM | POA: Diagnosis not present

## 2021-08-01 DIAGNOSIS — R404 Transient alteration of awareness: Secondary | ICD-10-CM | POA: Insufficient documentation

## 2021-08-01 DIAGNOSIS — I672 Cerebral atherosclerosis: Secondary | ICD-10-CM | POA: Diagnosis not present

## 2021-08-01 DIAGNOSIS — Z87891 Personal history of nicotine dependence: Secondary | ICD-10-CM

## 2021-08-01 DIAGNOSIS — R569 Unspecified convulsions: Secondary | ICD-10-CM | POA: Insufficient documentation

## 2021-08-01 DIAGNOSIS — Z79899 Other long term (current) drug therapy: Secondary | ICD-10-CM

## 2021-08-01 DIAGNOSIS — R4189 Other symptoms and signs involving cognitive functions and awareness: Principal | ICD-10-CM | POA: Insufficient documentation

## 2021-08-01 DIAGNOSIS — N400 Enlarged prostate without lower urinary tract symptoms: Secondary | ICD-10-CM | POA: Diagnosis present

## 2021-08-01 DIAGNOSIS — R4701 Aphasia: Secondary | ICD-10-CM | POA: Insufficient documentation

## 2021-08-01 DIAGNOSIS — J9601 Acute respiratory failure with hypoxia: Secondary | ICD-10-CM | POA: Insufficient documentation

## 2021-08-01 DIAGNOSIS — F259 Schizoaffective disorder, unspecified: Secondary | ICD-10-CM | POA: Diagnosis present

## 2021-08-01 DIAGNOSIS — G928 Other toxic encephalopathy: Secondary | ICD-10-CM | POA: Diagnosis present

## 2021-08-01 DIAGNOSIS — G9341 Metabolic encephalopathy: Secondary | ICD-10-CM | POA: Diagnosis not present

## 2021-08-01 DIAGNOSIS — R4781 Slurred speech: Secondary | ICD-10-CM | POA: Diagnosis present

## 2021-08-01 DIAGNOSIS — Z20822 Contact with and (suspected) exposure to covid-19: Secondary | ICD-10-CM | POA: Diagnosis not present

## 2021-08-01 DIAGNOSIS — E876 Hypokalemia: Secondary | ICD-10-CM | POA: Diagnosis present

## 2021-08-01 DIAGNOSIS — E119 Type 2 diabetes mellitus without complications: Secondary | ICD-10-CM | POA: Diagnosis present

## 2021-08-01 DIAGNOSIS — F209 Schizophrenia, unspecified: Secondary | ICD-10-CM | POA: Diagnosis not present

## 2021-08-01 DIAGNOSIS — Z9911 Dependence on respirator [ventilator] status: Secondary | ICD-10-CM | POA: Diagnosis not present

## 2021-08-01 DIAGNOSIS — R6250 Unspecified lack of expected normal physiological development in childhood: Secondary | ICD-10-CM | POA: Diagnosis present

## 2021-08-01 DIAGNOSIS — J189 Pneumonia, unspecified organism: Secondary | ICD-10-CM | POA: Diagnosis not present

## 2021-08-01 LAB — COMPREHENSIVE METABOLIC PANEL
ALT: 14 U/L (ref 0–44)
AST: 16 U/L (ref 15–41)
Albumin: 3.8 g/dL (ref 3.5–5.0)
Alkaline Phosphatase: 61 U/L (ref 38–126)
Anion gap: 8 (ref 5–15)
BUN: 16 mg/dL (ref 6–20)
CO2: 26 mmol/L (ref 22–32)
Calcium: 8.8 mg/dL — ABNORMAL LOW (ref 8.9–10.3)
Chloride: 105 mmol/L (ref 98–111)
Creatinine, Ser: 0.84 mg/dL (ref 0.61–1.24)
GFR, Estimated: >60 mL/min (ref >60)
Glucose, Bld: 169 mg/dL — ABNORMAL HIGH (ref 70–99)
Potassium: 4 mmol/L (ref 3.5–5.1)
Sodium: 139 mmol/L (ref 135–145)
Total Bilirubin: 0.5 mg/dL (ref 0.3–1.2)
Total Protein: 6.9 g/dL (ref 6.5–8.1)

## 2021-08-01 LAB — RESP PANEL BY RT-PCR (FLU A&B, COVID) ARPGX2
Influenza A by PCR: NEGATIVE
Influenza B by PCR: NEGATIVE
SARS Coronavirus 2 by RT PCR: NEGATIVE

## 2021-08-01 LAB — URINALYSIS, ROUTINE W REFLEX MICROSCOPIC
Bilirubin Urine: NEGATIVE
Glucose, UA: NEGATIVE mg/dL
Hgb urine dipstick: NEGATIVE
Ketones, ur: NEGATIVE mg/dL
Leukocytes,Ua: NEGATIVE
Nitrite: NEGATIVE
Protein, ur: NEGATIVE mg/dL
Specific Gravity, Urine: 1.034 — ABNORMAL HIGH (ref 1.005–1.030)
pH: 6 (ref 5.0–8.0)

## 2021-08-01 LAB — URINE DRUG SCREEN, QUALITATIVE (ARMC ONLY)
Amphetamines, Ur Screen: NOT DETECTED
Barbiturates, Ur Screen: NOT DETECTED
Benzodiazepine, Ur Scrn: NOT DETECTED
Cannabinoid 50 Ng, Ur ~~LOC~~: NOT DETECTED
Cocaine Metabolite,Ur ~~LOC~~: NOT DETECTED
MDMA (Ecstasy)Ur Screen: NOT DETECTED
Methadone Scn, Ur: NOT DETECTED
Opiate, Ur Screen: NOT DETECTED
Phencyclidine (PCP) Ur S: NOT DETECTED
Tricyclic, Ur Screen: POSITIVE — AB

## 2021-08-01 LAB — GLUCOSE, CAPILLARY
Glucose-Capillary: 147 mg/dL — ABNORMAL HIGH (ref 70–99)
Glucose-Capillary: 149 mg/dL — ABNORMAL HIGH (ref 70–99)

## 2021-08-01 LAB — CBC
HCT: 43.7 % (ref 39.0–52.0)
Hemoglobin: 14.3 g/dL (ref 13.0–17.0)
MCH: 28.1 pg (ref 26.0–34.0)
MCHC: 32.7 g/dL (ref 30.0–36.0)
MCV: 86 fL (ref 80.0–100.0)
Platelets: 247 10*3/uL (ref 150–400)
RBC: 5.08 MIL/uL (ref 4.22–5.81)
RDW: 12.3 % (ref 11.5–15.5)
WBC: 6.3 10*3/uL (ref 4.0–10.5)
nRBC: 0 % (ref 0.0–0.2)

## 2021-08-01 LAB — DIFFERENTIAL
Abs Immature Granulocytes: 0.02 10*3/uL (ref 0.00–0.07)
Basophils Absolute: 0 10*3/uL (ref 0.0–0.1)
Basophils Relative: 1 %
Eosinophils Absolute: 0.1 10*3/uL (ref 0.0–0.5)
Eosinophils Relative: 2 %
Immature Granulocytes: 0 %
Lymphocytes Relative: 22 %
Lymphs Abs: 1.4 10*3/uL (ref 0.7–4.0)
Monocytes Absolute: 0.3 10*3/uL (ref 0.1–1.0)
Monocytes Relative: 5 %
Neutro Abs: 4.4 10*3/uL (ref 1.7–7.7)
Neutrophils Relative %: 70 %

## 2021-08-01 LAB — TROPONIN I (HIGH SENSITIVITY)
Troponin I (High Sensitivity): 3 ng/L (ref <18)
Troponin I (High Sensitivity): 3 ng/L (ref <18)

## 2021-08-01 LAB — CBG MONITORING, ED: Glucose-Capillary: 160 mg/dL — ABNORMAL HIGH (ref 70–99)

## 2021-08-01 LAB — ETHANOL: Alcohol, Ethyl (B): <10 mg/dL (ref <10)

## 2021-08-01 LAB — PROTIME-INR
INR: 1 (ref 0.8–1.2)
Prothrombin Time: 13.2 seconds (ref 11.4–15.2)

## 2021-08-01 LAB — APTT: aPTT: 25 seconds (ref 24–36)

## 2021-08-01 LAB — MRSA NEXT GEN BY PCR, NASAL: MRSA by PCR Next Gen: NOT DETECTED

## 2021-08-01 MED ORDER — DOCUSATE SODIUM 50 MG/5ML PO LIQD: 100.0000 mg | Freq: Two times a day (BID) | ORAL | Status: DC | PRN

## 2021-08-01 MED ORDER — FENTANYL 2500MCG IN NS 250ML (10MCG/ML) PREMIX INFUSION
INTRAVENOUS | Status: AC
  Administered 2021-08-01: 50 ug/h via INTRAVENOUS
  Filled 2021-08-01: qty 250

## 2021-08-01 MED ORDER — CHLORHEXIDINE GLUCONATE CLOTH 2 % EX PADS
6.0000 | MEDICATED_PAD | Freq: Every day | CUTANEOUS | Status: DC
  Administered 2021-08-01 – 2021-08-06 (×4): 6 via TOPICAL

## 2021-08-01 MED ORDER — FENTANYL 2500MCG IN NS 250ML (10MCG/ML) PREMIX INFUSION: 50.0000 ug/h | INTRAVENOUS | Status: DC

## 2021-08-01 MED ORDER — FENTANYL BOLUS VIA INFUSION
50.0000 ug | INTRAVENOUS | Status: DC | PRN
  Filled 2021-08-01: qty 100

## 2021-08-01 MED ORDER — MIDAZOLAM BOLUS VIA INFUSION
0.0000 mg | INTRAVENOUS | Status: DC | PRN
  Filled 2021-08-01: qty 5

## 2021-08-01 MED ORDER — FENTANYL 2500MCG IN NS 250ML (10MCG/ML) PREMIX INFUSION
50.0000 ug/h | INTRAVENOUS | Status: DC
  Administered 2021-08-01: 100 ug/h via INTRAVENOUS
  Administered 2021-08-02: 75 ug/h via INTRAVENOUS
  Filled 2021-08-01: qty 250

## 2021-08-01 MED ORDER — LEVETIRACETAM IN NACL 1000 MG/100ML IV SOLN
1000.0000 mg | Freq: Once | INTRAVENOUS | Status: AC
  Administered 2021-08-01: 1000 mg via INTRAVENOUS
  Filled 2021-08-01: qty 100

## 2021-08-01 MED ORDER — MIDAZOLAM HCL 2 MG/2ML IJ SOLN: 2.0000 mg | INTRAMUSCULAR | Status: DC | PRN

## 2021-08-01 MED ORDER — DOCUSATE SODIUM 50 MG/5ML PO LIQD
100.0000 mg | Freq: Two times a day (BID) | ORAL | Status: DC
  Administered 2021-08-01 – 2021-08-03 (×4): 100 mg
  Filled 2021-08-01 (×4): qty 10

## 2021-08-01 MED ORDER — INSULIN ASPART 100 UNIT/ML IJ SOLN
0.0000 [IU] | INTRAMUSCULAR | Status: DC
  Administered 2021-08-01 – 2021-08-02 (×3): 2 [IU] via SUBCUTANEOUS
  Administered 2021-08-03: 3 [IU] via SUBCUTANEOUS
  Administered 2021-08-04 (×3): 2 [IU] via SUBCUTANEOUS
  Administered 2021-08-04: 3 [IU] via SUBCUTANEOUS
  Administered 2021-08-05 (×2): 2 [IU] via SUBCUTANEOUS
  Administered 2021-08-05: 3 [IU] via SUBCUTANEOUS
  Administered 2021-08-05: 2 [IU] via SUBCUTANEOUS

## 2021-08-01 MED ORDER — POLYETHYLENE GLYCOL 3350 17 G PO PACK
17.0000 g | PACK | Freq: Every day | ORAL | Status: DC
  Administered 2021-08-01 – 2021-08-02 (×2): 17 g
  Filled 2021-08-01 (×2): qty 1

## 2021-08-01 MED ORDER — LORAZEPAM 2 MG/ML IJ SOLN
1.0000 mg | Freq: Once | INTRAMUSCULAR | Status: AC
  Administered 2021-08-01: 1 mg via INTRAVENOUS
  Filled 2021-08-01: qty 1

## 2021-08-01 MED ORDER — LORAZEPAM 2 MG/ML IJ SOLN
2.0000 mg | INTRAMUSCULAR | Status: DC | PRN
Start: 2021-08-01 — End: 2021-08-06

## 2021-08-01 MED ORDER — LACTATED RINGERS IV SOLN: INTRAVENOUS | Status: DC

## 2021-08-01 MED ORDER — FENTANYL CITRATE PF 50 MCG/ML IJ SOSY: 50.0000 ug | PREFILLED_SYRINGE | Freq: Once | INTRAMUSCULAR | Status: DC

## 2021-08-01 MED ORDER — IOHEXOL 350 MG/ML SOLN
100.0000 mL | Freq: Once | INTRAVENOUS | Status: AC | PRN
  Administered 2021-08-01: 100 mL via INTRAVENOUS

## 2021-08-01 MED ORDER — HEPARIN SODIUM (PORCINE) 5000 UNIT/ML IJ SOLN
5000.0000 [IU] | Freq: Three times a day (TID) | INTRAMUSCULAR | Status: DC
  Administered 2021-08-01 – 2021-08-04 (×8): 5000 [IU] via SUBCUTANEOUS
  Filled 2021-08-01 (×8): qty 1

## 2021-08-01 MED ORDER — SODIUM CHLORIDE 0.9 % IV SOLN: 3000.0000 mg | Freq: Once | INTRAVENOUS | Status: DC

## 2021-08-01 MED ORDER — SIMVASTATIN 20 MG PO TABS
40.0000 mg | ORAL_TABLET | Freq: Every day | ORAL | Status: DC
  Administered 2021-08-01 – 2021-08-05 (×5): 40 mg
  Filled 2021-08-01 (×5): qty 2

## 2021-08-01 MED ORDER — MIDAZOLAM-SODIUM CHLORIDE 100-0.9 MG/100ML-% IV SOLN
0.0000 mg/h | INTRAVENOUS | Status: DC
  Administered 2021-08-01: 2 mg/h via INTRAVENOUS

## 2021-08-01 MED ORDER — NOREPINEPHRINE 4 MG/250ML-% IV SOLN
INTRAVENOUS | Status: AC
  Filled 2021-08-01: qty 250

## 2021-08-01 MED ORDER — EPINEPHRINE 1 MG/10ML IJ SOSY
PREFILLED_SYRINGE | INTRAMUSCULAR | Status: AC | PRN
  Administered 2021-08-01: 1 mg via INTRAVENOUS

## 2021-08-01 MED ORDER — NALOXONE HCL 2 MG/2ML IJ SOSY
0.4000 mg | PREFILLED_SYRINGE | Freq: Once | INTRAMUSCULAR | Status: AC
  Administered 2021-08-01: 0.4 mg via INTRAVENOUS
  Filled 2021-08-01: qty 2

## 2021-08-01 MED ORDER — ROCURONIUM BROMIDE 10 MG/ML (PF) SYRINGE
PREFILLED_SYRINGE | INTRAVENOUS | Status: AC
  Administered 2021-08-01: 100 mg
  Filled 2021-08-01: qty 10

## 2021-08-01 MED ORDER — LEVETIRACETAM IN NACL 1000 MG/100ML IV SOLN: 3000.0000 mg | INTRAVENOUS | Status: DC

## 2021-08-01 MED ORDER — KETAMINE HCL 10 MG/ML IJ SOLN
INTRAMUSCULAR | Status: AC | PRN
  Administered 2021-08-01: 100 mg via INTRAVENOUS

## 2021-08-01 MED ORDER — MIDAZOLAM-SODIUM CHLORIDE 100-0.9 MG/100ML-% IV SOLN
0.5000 mg/h | INTRAVENOUS | Status: DC
  Administered 2021-08-01: 6 mg/h via INTRAVENOUS
  Administered 2021-08-02: 5 mg/h via INTRAVENOUS
  Administered 2021-08-02: 4 mg/h via INTRAVENOUS
  Filled 2021-08-01 (×2): qty 100

## 2021-08-01 MED ORDER — ORAL CARE MOUTH RINSE
15.0000 mL | OROMUCOSAL | Status: DC
  Administered 2021-08-01 – 2021-08-03 (×20): 15 mL via OROMUCOSAL

## 2021-08-01 MED ORDER — PANTOPRAZOLE SODIUM 40 MG IV SOLR
40.0000 mg | Freq: Every day | INTRAVENOUS | Status: DC
  Administered 2021-08-01 – 2021-08-03 (×3): 40 mg via INTRAVENOUS
  Filled 2021-08-01 (×3): qty 10

## 2021-08-01 MED ORDER — KETAMINE HCL 50 MG/5ML IJ SOSY
PREFILLED_SYRINGE | INTRAMUSCULAR | Status: AC
  Filled 2021-08-01: qty 10

## 2021-08-01 MED ORDER — PANTOPRAZOLE 2 MG/ML SUSPENSION: 40.0000 mg | Freq: Every day | ORAL | Status: DC

## 2021-08-01 MED ORDER — CHLORHEXIDINE GLUCONATE 0.12% ORAL RINSE (MEDLINE KIT)
15.0000 mL | Freq: Two times a day (BID) | OROMUCOSAL | Status: DC
  Administered 2021-08-01 – 2021-08-06 (×6): 15 mL via OROMUCOSAL

## 2021-08-01 MED ORDER — MIDAZOLAM-SODIUM CHLORIDE 100-0.9 MG/100ML-% IV SOLN
INTRAVENOUS | Status: AC
  Filled 2021-08-01: qty 100

## 2021-08-01 MED ORDER — POLYETHYLENE GLYCOL 3350 17 G PO PACK: 17.0000 g | PACK | Freq: Every day | ORAL | Status: DC | PRN

## 2021-08-01 NOTE — Consult Note (Signed)
?                    NEURO HOSPITALIST CONSULT NOTE  ? ?Requestig physician: Dr. Lynetta Mare ? ?Reason for Consult: AMS, possibly secondary to seizure activity ? ?History obtained from:  Family and Chart    ? ?HPI:                                                                                                                                         ? Tyrone Little is an 61 y.o. male with a PMHC of schizoaffective disorder and developmental delay who has been transferred to [facility] from [facility] for continuous EEG monitoring and management of acute AMS. He initially presented to the [facility] ED this morning after he had witnessed seizure activity at his group home. He has no prior history of seizures.  ? ?He was seen by Neurology at [facility]. Per Dr. Johny Chess note: "Tyrone Little is a 61 y.o. male past medical history of schizophrenia, developmental delay, resident of a group home, brought in for evaluation of sudden onset of decreased level of consciousness after he started having shaking/seizure-like activity followed by drooling from his mouth this morning. His last known well was around 7:30 AM when he was having breakfast after that he sat on the couch waiting for his boss to take him to his daycare from the group home and it was noted that he suddenly started shaking with foaming at his mouth and drooling.  EMS was called.  They brought him to the emergency room.  On initial evaluation, exam was nonfocal but on the ED provider evaluation again, he was noted to have some slurred speech and left-sided drift for which a code stroke was activated. Patient unable to provide reliable history. I called the brother and spoke with him-he is the legal guardian. Spoke with someone at the facility who confirmed that he woke up this morning, had breakfast and while sitting on the couch had the episode described above.  Not sure if he has any history of seizures in the past." ? ?His home medications list does not include any anticonvulsants. Home  meds include fluphenazine, risperidone and quetiapine.  ?  ?Routine EEG at [facility] revealed continuous generalized slowing without electrographic seizures.  ? ?On further interview with family members, several behavioral features suggestive of possible undiagnosed autism were described, occurring since at least adolescence. These features include some impairment in the patient's social interactions, restricted/stereotyped interests, decreased displays of affection/empathy and repetitive/stereotyped motor behaviors. They state that he was not diagnosed with schizophrenia until a few years ago. Since adolescence he has been dependent on family for care and supervision. He was considered to be developmentally normal as a young child. He was regarded as a normal student during childhood as well. Behavioral and cognitive changes were first noticed during adolescence.  ? ?Past Medical History:  ?Diagnosis Date  ?  Schizotaxia   ? ? ?No past surgical history on file. ? ?No family history on file.         ? ?Social History:  reports that he quit smoking about 16 years ago. His smoking use included cigarettes. He has never used smokeless tobacco. He reports that he does not drink alcohol and does not use drugs. ? ?Not on File ? ?MEDICATIONS:                                                                                                                     ?Prior to Admission:  ?Medications Prior to Admission  ?Medication Sig Dispense Refill Last Dose  ? acetaminophen (TYLENOL) 500 MG tablet Take 500 mg by mouth every 6 (six) hours as needed.     ? cetirizine (ZYRTEC) 10 MG tablet Take by mouth.     ? Cholecalciferol (VITAMIN D3) 125 MCG (5000 UT) CAPS TAKE 1 CAPSULE BY MOUTH ONCE DAILY  *TAKE WITH FOOD* 28 capsule 11   ? fluPHENAZine (PROLIXIN) 5 MG tablet Take 5 mg by mouth at bedtime.     ? hydrOXYzine (ATARAX) 50 MG tablet Take 50 mg by mouth 3 (three) times daily.     ? ibuprofen (ADVIL) 600 MG tablet Take 600 mg by mouth  every 6 (six) hours as needed.     ? Multiple Vitamin (MULTI-VITAMIN) tablet Take 1 tablet by mouth daily.     ? omega-3 acid ethyl esters (LOVAZA) 1 g capsule Take by mouth 2 (two) times daily.     ? pioglitazone (ACTOS) 30 MG tablet Take 30 mg by mouth daily.     ? QUEtiapine (SEROQUEL) 200 MG tablet Take 200 mg by mouth at bedtime.     ? risperiDONE (RISPERDAL) 2 MG tablet Take 2 mg by mouth 2 (two) times daily.     ? simvastatin (ZOCOR) 40 MG tablet Take 40 mg by mouth at bedtime.     ? tamsulosin (FLOMAX) 0.4 MG CAPS capsule Take 0.4 mg by mouth daily.     ? ?Scheduled: ? chlorhexidine gluconate (MEDLINE KIT)  15 mL Mouth Rinse BID  ? Chlorhexidine Gluconate Cloth  6 each Topical Daily  ? docusate  100 mg Per Tube BID  ? fentaNYL (SUBLIMAZE) injection  50 mcg Intravenous Once  ? heparin  5,000 Units Subcutaneous Q8H  ? insulin aspart  0-15 Units Subcutaneous Q4H  ? mouth rinse  15 mL Mouth Rinse 10 times per day  ? pantoprazole (PROTONIX) IV  40 mg Intravenous QHS  ? polyethylene glycol  17 g Per Tube Daily  ? simvastatin  40 mg Per Tube q1800  ? ?Continuous: ? fentaNYL infusion INTRAVENOUS    ? lactated ringers    ? ? ?ROS:                                                                                                                                       ?  Unable to obtain due to unresponsiveness on sedation.  ? ? ?Blood pressure (!) 133/103, pulse (!) 58, temperature 98.6 ?F (37 ?C), temperature source Axillary, resp. rate 14, weight 104.1 kg, SpO2 100 %. ? ? ?General Examination:                                                                                                      ? ?Physical Exam  ?HEENT-  [place]/AT. No nuchal rigidity.    ?Lungs- Intubated ?Extremities- No edema ? ?Neurological Examination ?Mental Status: Intubated and sedated on Versed gtt at a rate of 6. Eyes closed without opening to any stimuli. No response to voice or other auditory stimuli. No purposeful movements to noxious. No  spontaneous limb movements.  ?Cranial Nerves: ?II: Pupils equal, minimally reactive and sluggish at 2 mm. No blink to threat.   ?III,IV, VI: Eyes are conjugate near the midline. No nystagmus. No oculocephalic reflex elicitable.  ?V,VII: Weak corneals bilaterally ?VIII: No responses to auditory stimuli ?IX,X: Intubated ?XI: Head is midline ?XII: Unable to assess ?Motor/Sensory: ?BUE: Flaccid tone with slight movement bilaterally to sustained arm pinch on each side. Does not elevate antigravity except minimally at forearms.  ?BLE: Flaccid tone with semipurposeful fidgeting movements at ankles and toes to sustained noxious plantar stimulation. Also will move to thigh pinch.  ?Deep Tendon Reflexes: 1-2+ and symmetric brachioradialis and patellae.  ?Plantars: Right: downgoing  Left: downgoing ?Cerebellar/Gait: Unable to assess ? ?  ?Lab Results: ?Basic Metabolic Panel: ?Recent Labs  ?Lab 08/01/21 ?1448  ?NA 139  ?K 4.0  ?CL 105  ?CO2 26  ?GLUCOSE 169*  ?BUN 16  ?CREATININE 0.84  ?CALCIUM 8.8*  ? ? ?CBC: ?Recent Labs  ?Lab 08/01/21 ?1856  ?WBC 6.3  ?NEUTROABS 4.4  ?HGB 14.3  ?HCT 43.7  ?MCV 86.0  ?PLT 247  ? ? ?Cardiac Enzymes: ?No results for input(s): CKTOTAL, CKMB, CKMBINDEX, TROPONINI in the last 168 hours. ? ?Lipid Panel: ?No results for input(s): CHOL, TRIG, HDL, CHOLHDL, VLDL, LDLCALC in the last 168 hours. ? ?Imaging: ?DG Abdomen 1 View ? ?Result Date: 08/01/2021 ?CLINICAL DATA:  OG tube placement. EXAM: ABDOMEN - 1 VIEW COMPARISON:  None Available. FINDINGS: An enteric tube is looped in the stomach with tip overlying the distal aspect of the stomach. Excreted contrast material is noted in the renal collecting systems. No dilated loops of bowel are seen in the included portion of the abdomen to suggest obstruction. Multiple monitoring leads overlie the patient. No acute osseous abnormality is seen. IMPRESSION: Enteric tube in the stomach. Electronically Signed   By: Logan Bores M.D.   On: 08/01/2021 12:09  ? ?DG  Chest Portable 1 View ? ?Result Date: 08/01/2021 ?CLINICAL DATA:  Intubation. EXAM: PORTABLE CHEST 1 VIEW COMPARISON:  Chest radiographs 08/01/2021 at 10:50 a.m. FINDINGS: An endotracheal tube has been placed and termi

## 2021-08-01 NOTE — ED Triage Notes (Signed)
Pt here from a group home with c/o aloc and asaphia lsn 0730 , cgb 154 , per group home pt may have had some seizure like activity  ?

## 2021-08-01 NOTE — Progress Notes (Addendum)
eLink Physician-Brief Progress Note ?Patient Name: Tyrone Little ?DOB: February 12, 1961 ?MRN: [id number] ? ? ?Date of Service ? 08/01/2021  ?HPI/Events of Note ? 61/M with history of schizophrenia, developmental delay, who was brought to the ED following a witnessed seizure event. Initial CT head was negative for intracranial pathology. No hypoglycemia, no severe electrolyte abnormalities noted on labs.  UDS positive only for TCAs.  ?During his course in the ED, pt was reported to be increasingly hypoxemic, and ultimately needed intubation.  ?He was then transferred to this facility for ICU level of care.  ?  ?eICU Interventions ? Possible seizures, acute encephalopathy ?- Initial head CT negative.  ?- No hypoglycemia reported.  ?- No severe electrolyte abnormalities on BMP.  ?- No findings to suggest ongoing septic process.  ?- EEG performed - no seizure or epileptiform discharged seen. Findings showing diffuse encephalopathy, may be related to sedation.  ?- Maintain seizure precautions ?- Plan for daily sedation holiday to facilitate assessement of neurologic status.  ?- Will follow further neurology recommendations ? ?2. Acute hypoxemic respiratory failure ?- ?from hypoventilation due to above.  ?- Will maintain on low tidal volume ventilation strategy. Target plateau pressures <30 ?- Titrate FIO2 to target SPO2 >90% ?- Will follow serial ABG, make further vent adjustments as warranted.   ? ? ? ?  ? ?[place] ?08/01/2021, 9:07 PM ? ?2:55AM ?HR dropping to 40s (baseline 50s-60s) ?No associated hypotension.  ?RN had downtitrated fentanyl. ?Will recheck EKG.  ?Will have AM labs drawn early, see if there are any abnormalities that need to be corrected.  ?Will continue to monitor closely for now.   ? ? ?6:35AM ?Notified of urine output of 232ml since arrival.  ?Urine reported to appear dark, concentrated.  ?Creatinine 1.01 on AM labs.  ?Will give 1L fluid bolus.  ?

## 2021-08-01 NOTE — Consult Note (Addendum)
Neurology Consultation  Reason for Consult: Code stroke for left-sided weakness, slurred speech Referring Physician: Dr. Donna Bernard  CC: Seizure, slurred speech  History is obtained from: Chart, brother over the phone, someone from the facility  HPI: Tyrone Little is a 61 y.o. male past medical history of schizophrenia, developmental delay, resident of a group home, brought in for evaluation of sudden onset of decreased level of consciousness after he started having shaking/seizure-like activity followed by drooling from his mouth this morning. His last known well was around 7:30 AM when he was having breakfast after that he sat on the couch waiting for his boss to take him to his daycare from the group home and it was noted that he suddenly started shaking with foaming at his mouth and drooling.  EMS was called.  They brought him to the emergency room.  On initial evaluation, exam was nonfocal but on the ED provider evaluation again, he was noted to have some slurred speech and left-sided drift for which a code stroke was activated. Patient unable to provide reliable history I called the brother and spoke with him-he is the legal guardian. Spoke with someone at the facility who confirmed that he woke up this morning, had breakfast and while sitting on the couch had the episode described above.  Not sure if he has any history of seizures in the past.   LKW: 0730 hrs tpa given?: no, seizure more likely than stroke Premorbid modified Rankin scale (mRS): 2-3   ROS: Unable to obtain due to altered mental status.   Past Medical History:  Diagnosis Date   Schizotaxia         History reviewed. No pertinent family history.   Social History:   reports that he quit smoking about 16 years ago. His smoking use included cigarettes. He has never used smokeless tobacco. He reports that he does not drink alcohol and does not use drugs.  Medications  Current Facility-Administered Medications:     levETIRAcetam (KEPPRA) IVPB 1000 mg/100 mL premix, 1,000 mg, Intravenous, Once **FOLLOWED BY** levETIRAcetam (KEPPRA) IVPB 1000 mg/100 mL premix, 1,000 mg, Intravenous, Once **FOLLOWED BY** levETIRAcetam (KEPPRA) IVPB 1000 mg/100 mL premix, 1,000 mg, Intravenous, Once, Bradler, Clent Jacks, MD   LORazepam (ATIVAN) injection 1 mg, 1 mg, Intravenous, Once, Milon Dikes, MD  Current Outpatient Medications:    acetaminophen (TYLENOL) 500 MG tablet, Take 500 mg by mouth every 6 (six) hours as needed., Disp: , Rfl:    cetirizine (ZYRTEC) 10 MG tablet, Take by mouth., Disp: , Rfl:    Cholecalciferol (VITAMIN D3) 125 MCG (5000 UT) CAPS, TAKE 1 CAPSULE BY MOUTH ONCE DAILY  *TAKE WITH FOOD*, Disp: 28 capsule, Rfl: 11   fluPHENAZine (PROLIXIN) 5 MG tablet, Take 5 mg by mouth at bedtime., Disp: , Rfl:    hydrOXYzine (ATARAX) 50 MG tablet, Take 50 mg by mouth 3 (three) times daily., Disp: , Rfl:    ibuprofen (ADVIL) 600 MG tablet, Take 600 mg by mouth every 6 (six) hours as needed., Disp: , Rfl:    Multiple Vitamin (MULTI-VITAMIN) tablet, Take 1 tablet by mouth daily., Disp: , Rfl:    omega-3 acid ethyl esters (LOVAZA) 1 g capsule, Take by mouth 2 (two) times daily., Disp: , Rfl:    pioglitazone (ACTOS) 30 MG tablet, Take 30 mg by mouth daily., Disp: , Rfl:    QUEtiapine (SEROQUEL) 200 MG tablet, Take 200 mg by mouth at bedtime., Disp: , Rfl:    risperiDONE (RISPERDAL) 2 MG  tablet, Take 2 mg by mouth 2 (two) times daily., Disp: , Rfl:    simvastatin (ZOCOR) 40 MG tablet, Take 40 mg by mouth at bedtime., Disp: , Rfl:    tamsulosin (FLOMAX) 0.4 MG CAPS capsule, Take 0.4 mg by mouth daily., Disp: , Rfl:   Exam: Current vital signs: BP 119/83   Pulse (!) 59   Temp 99.3 F (37.4 C) (Axillary)   Resp 20   SpO2 93%  Vital signs in last 24 hours: Temp:  [99.3 F (37.4 C)] 99.3 F (37.4 C) (05/03 0846) Pulse Rate:  [59] 59 (05/03 0846) Resp:  [20] 20 (05/03 0846) BP: (119)/(83) 119/83 (05/03 0846) SpO2:  [93  %] 93 % (05/03 0846) General: Extremely drowsy, opens eyes to voice but keeps on sleeping in between HEENT: Normocephalic atraumatic, no neck stiffness Respiratory: Sonorous breathing at times but then wakes up and breathes normally, chest is clear Cardiovascular: Regular rate rhythm Abdomen nondistended nontender Neurological exam Extremely drowsy, opens eyes to voice but falls asleep in between. Extremely dysarthric Was able to tell me his name and correct age Could not tell me the correct month Was able to follow simple commands Cranial nerves: Pupils are equal round react to light, no gaze preference or deviation, visual fields appear unrestricted, face appears symmetric. Motor examination with antigravity strength in all 4 extremities with not much of a drift but symmetric generalized weakness in all fours Sensory exam: Intact to touch Coordination difficult to assess given his mentation and poor attention concentration - Stroke scale 1a Level of Conscious.: 1 1b LOC Questions: 1 1c LOC Commands: 0 2 Best Gaze: 0 3 Visual: 0 4 Facial Palsy: 0 5a Motor Arm - left: 0 5b Motor Arm - Right: 0 6a Motor Leg - Left: 0 6b Motor Leg - Right:0 7 Limb Ataxia: 0 8 Sensory: 0 9 Best Language: 0 10 Dysarthria: 2 11 Extinct. and Inatten.: 0 TOTAL: 4    Labs I have reviewed labs in epic and the results pertinent to this consultation are:   CBC    Component Value Date/Time   WBC 6.3 08/01/2021 0854   RBC 5.08 08/01/2021 0854   HGB 14.3 08/01/2021 0854   HCT 43.7 08/01/2021 0854   PLT 247 08/01/2021 0854   MCV 86.0 08/01/2021 0854   MCH 28.1 08/01/2021 0854   MCHC 32.7 08/01/2021 0854   RDW 12.3 08/01/2021 0854   LYMPHSABS 1.4 08/01/2021 0854   MONOABS 0.3 08/01/2021 0854   EOSABS 0.1 08/01/2021 0854   BASOSABS 0.0 08/01/2021 0854    CMP     Component Value Date/Time   NA 139 08/01/2021 0854   K 4.0 08/01/2021 0854   CL 105 08/01/2021 0854   CO2 26 08/01/2021 0854    GLUCOSE 169 (H) 08/01/2021 0854   BUN 16 08/01/2021 0854   CREATININE 0.84 08/01/2021 0854   CREATININE 1.07 05/09/2021 1123   CALCIUM 8.8 (L) 08/01/2021 0854   PROT 6.9 08/01/2021 0854   ALBUMIN 3.8 08/01/2021 0854   AST 16 08/01/2021 0854   ALT 14 08/01/2021 0854   ALKPHOS 61 08/01/2021 0854   BILITOT 0.5 08/01/2021 0854   GFRNONAA >60 08/01/2021 0854    Lipid Panel     Component Value Date/Time   CHOL 125 05/09/2021 1123   TRIG 88 05/09/2021 1123   HDL 38 (L) 05/09/2021 1123   CHOLHDL 3.3 05/09/2021 1123   LDLCALC 70 05/09/2021 1123     Imaging I have reviewed the  images obtained: CT-head no acute changes CT angiography-no emergent LVO CT perfusion-no perfusion deficits  Assessment:  61 year old with schizophrenia, developmental delay, resident of a group home brought in with what looks like a witnessed seizure.  Extremely encephalopathic at this time, likely postictal.  There was some concern of left-sided weakness along with slurred speech for which a code stroke was activated.  On my examination, motor system wise he appeared symmetrically weak in all fours but was able to go antigravity without drift in all fours. Speech extremely dysarthric, sonorously breathing. Suspect seizure/status epilepticus versus polypharmacy  Recommendations: Ativan 1 mg IV x1 Keppra 3000 mg IV x1 followed by Keppra 500 mg twice daily Stat EEG Maintain seizure precautions CXR, tox screen, UA, blood c/s  ADDENDUM 1050hrs Director of the group home came in.  She says that there might be a chance that he took the wrong medications as they are dispensed and cups to the residents and he did not look at what he took.  She does not think that any of the residents at the group home are on opiates but Dr. Vicente Males and I discussed and we gave him Narcan injection without improvement.  ADDENDUM 1130 hrs Patient became suddenly hypoxic and bradycardic.  When the EEG tech went into the room, he  was saturating in the 50s with perioral cyanosis.  ED provider was notified.  He was emergently intubated. No witnessed seizure activity noted. At this time, etiology of his presentation is not very clear.  It could be that he is having recurrent seizures but none were witnessed. He was loaded with Keppra and given Ativan 1 mg prior. I would recommend routine EEG if possible timely otherwise transfer to [facility] for continuous EEG monitoring and further recommendations based on EEG results and clinical course. Medical management per ER and CCM. I have spoken to my neuro hospitalist colleague at [facility] and my team is aware.  They will see the patient in consultation once he arrives there  -- Milon Dikes, MD Neurologist [facility] Pager: [phone]  CRITICAL CARE ATTESTATION Performed by: Milon Dikes, MD Total critical care time: 65 minutes Critical care time was exclusive of separately billable procedures and treating other patients and/or supervising APPs/Residents/Students Critical care was necessary to treat or prevent imminent or life-threatening deterioration due to altered mental status, seizures and concern for status epilepticus. This patient is critically ill and at significant risk for neurological worsening and/or death and care requires constant monitoring. Critical care was time spent personally by me on the following activities: development of treatment plan with patient and/or surrogate as well as nursing, discussions with consultants, evaluation of patient's response to treatment, examination of patient, obtaining history from patient or surrogate, ordering and performing treatments and interventions, ordering and review of laboratory studies, ordering and review of radiographic studies, pulse oximetry, re-evaluation of patient's condition, participation in multidisciplinary rounds and medical decision making of high complexity in the care  of this patient.

## 2021-08-01 NOTE — ED Notes (Signed)
Xray called for post intubation scan

## 2021-08-01 NOTE — ED Notes (Signed)
Pt placed on 2 liters O2 n/c for sats of 91 % on room air  ?

## 2021-08-01 NOTE — ED Notes (Signed)
Called Code Stroke to [facility] per Dr. Vicente Males [phone] informed by MD  called [phone] to [facility] spoke to [facility]  ?

## 2021-08-01 NOTE — ED Notes (Signed)
Peri care performed by [place], Charity fundraiser and this tech. Pt sleeping at this time. ?

## 2021-08-01 NOTE — Procedures (Addendum)
Patient Name: Tyrone Little  ?MRN: [id number]  ?Epilepsy Attending: Charlsie Quest  ?Referring Physician/Provider: Milon Dikes, MD ?Date: 08/01/2021 ?Duration: 22.32 mins ? ?Patient history: 61 year old with schizophrenia, developmental delay, resident of a group home brought in with what looks like a witnessed seizure.  EEG to evaluate for seizure. ? ?Level of alertness: Lethargic/sedated ? ?AEDs during EEG study: Versed ? ?Technical aspects: This EEG study was done with scalp electrodes positioned according to the 10-20 International system of electrode placement. Electrical activity was acquired at a sampling rate of 500Hz  and reviewed with a high frequency filter of 70Hz  and a low frequency filter of 1Hz . EEG data were recorded continuously and digitally stored.  ? ?Description: EEG showed continuous generalized 3 to 5 Hz theta-delta slowing admixed with an excessive amount of 15 to 18 Hz beta activity  distributed symmetrically and diffusely. Hyperventilation and photic stimulation were not performed.    ? ?ABNORMALITY ?- Continuous slow, generalized ?- Excessive beta, generalized ? ?IMPRESSION: ?This study is suggestive of moderate to severe diffuse encephalopathy, nonspecific etiology but likely related to sedation. No seizures or epileptiform discharges were seen throughout the recording. ? ?  ? ?

## 2021-08-01 NOTE — Progress Notes (Signed)
vLTM started  all impedances below 10kohms   [facility] to monitor   Patient event button tested 

## 2021-08-01 NOTE — ED Notes (Signed)
Neurologist at bedside. 

## 2021-08-01 NOTE — H&P (Signed)
? ?NAMEJarryd Little, MRN:  [id number], DOB:  [birth year], LOS: 0 ?ADMISSION DATE:  08/01/2021, CONSULTATION DATE:  5/3 ?REFERRING MD:  Dr. Vicente Males, CHIEF COMPLAINT:  seizure  ? ?History of Present Illness:  ?Patient is a 61 yo M w/ pertinent PMH of schizoaffective disorder, developmental delay (baseline able to carry on conversation and is ambulatory) presents to [facility] ED 5/3 with acute new onset seizures. ? ?On 5/3, patient was at group home. A resident appreciated seizure like activity. LKN 7:30 am 5/3. Patient suddenly started shaking and foaming and drooling from mouth appreciated.  Decreased LOC. Patient brought to [facility] ED by EMS for new onset seizure activity. Code stroke activated for concern w/ slurred sppech and left sided drift. CT head with no acute abnormality. Neuro consulted. Loaded patient with keppra and ativan x1. EEG ordered. Patient initially hemodynamically stable on minimal O2.  Later patient's sats in 50s and was placed on NRB and HR brady 50s. Patient was intubated for airway protection. CXR showing ETT good position; mild atelectasis. Neuro recommended patient be transferred to [facility] for cEEG. PCCM consulted for ICU admission. ? ?Pertinent Labs: glucose 160, ethanol wnl, cbc/cmp wnl, covid/flu negative, ua negative, UDS positive for tricyclic (on psych meds at home) ? ? ?Pertinent  Medical History  ? ?Past Medical History:  ?Diagnosis Date  ? Schizotaxia   ? ? ? ?Significant Hospital Events: ?Including procedures, antibiotic start and stop dates in addition to other pertinent events   ?5/3: admitted to [facility] w/ seizure; intubated; transferring to [facility] for cEEG ? ?Interim History / Subjective:  ?Sedated on Fentanyl + Versed. Comfortable, Non-responsive. ? ?Objective   ?There were no vitals taken for this visit. ?   ?Vent Mode: AC ?FiO2 (%):  [60 %] 60 % ?Set Rate:  [14 bmp-20 bmp] 14 bmp ?Vt Set:  [500 mL] 500 mL ?PEEP:  [5 cmH20] 5 cmH20  ?No intake or output data in the 24 hours ending 08/01/21  2111 ?There were no vitals filed for this visit. ? ?Examination: ?General: Adult male, resting in bed, in NAD. ?Neuro: Sedated, not responsive. ?HEENT: [place]/AT. Sclerae anicteric. ETT in place. ?Cardiovascular: RRR, no M/R/G.  ?Lungs: Respirations even and unlabored.  CTA bilaterally, No W/R/R. ?Abdomen: BS x 4, soft, NT/ND.  ?Musculoskeletal: No gross deformities, no edema.  ?Skin: Intact, warm, no rashes. ? ? ?Assessment & Plan:  ? ?New onset Seizure. ?Acute Encephalopathy: Likely postictal; hx of developmental delay; CT head no acute abnormality; ethanol wnl; UA wnl; UDS positive for tricyclic (on psych meds at home). ?-RN to notify neuro of arrival to [facility] for cEEG. ?-AEDs per neuro. ?-consider MRI?. ?-frequent neuro checks. ?-seizure precautions in place. ? ?Acute respiratory failure w/ hypoxia. ?-LTVV strategy with tidal volumes of 6-8 cc/kg ideal body weight. ?-Wean PEEP/FiO2 for SpO2 >92%. ?-VAP bundle in place. ?-Daily SAT and SBT when appropriate. ?-PAD protocol in place. ?-wean sedation for RASS goal 0 to -1. ? ?Hx of schizoaffective disorder: on seroquel, resperdal, fluphenazine, hydroxyzine.  ?- hold home meds for now. ? ?DM?: pioglitazone on home med list? ?- SSI. ? ?Hx of HLD. ?- Continue home statin. ? ?Hx of BPH. ?- Hold home flomax until can take PO. ? ?Best Practice (right click and "Reselect all SmartList Selections" daily)  ? ?Diet/type: NPO w/ meds via tube ?DVT prophylaxis: prophylactic heparin  ?GI prophylaxis: PPI ?Lines: N/A ?Foley:  Yes, and it is still needed ?Code Status:  full code ?Last date of multidisciplinary goals of care discussion [  pending] ? ?Labs   ?CBC: ?Recent Labs  ?Lab 08/01/21 ?0854  ?WBC 6.3  ?NEUTROABS 4.4  ?HGB 14.3  ?HCT 43.7  ?MCV 86.0  ?PLT 247  ? ? ?Basic Metabolic Panel: ?Recent Labs  ?Lab 08/01/21 ?0854  ?NA 139  ?K 4.0  ?CL 105  ?CO2 26  ?GLUCOSE 169*  ?BUN 16  ?CREATININE 0.84  ?CALCIUM 8.8*  ? ?GFR: ?CrCl cannot be calculated (Unknown ideal weight.). ?Recent Labs   ?Lab 08/01/21 ?0854  ?WBC 6.3  ? ? ?Liver Function Tests: ?Recent Labs  ?Lab 08/01/21 ?0854  ?AST 16  ?ALT 14  ?ALKPHOS 61  ?BILITOT 0.5  ?PROT 6.9  ?ALBUMIN 3.8  ? ?No results for input(s): LIPASE, AMYLASE in the last 168 hours. ?No results for input(s): AMMONIA in the last 168 hours. ? ?ABG ?   ?Component Value Date/Time  ? PHART 7.49 (H) 08/01/2021 1843  ? PCO2ART 32 08/01/2021 1843  ? PO2ART 150 (H) 08/01/2021 1843  ? HCO3 24.4 08/01/2021 1843  ? O2SAT 100 08/01/2021 1843  ?  ? ?Coagulation Profile: ?Recent Labs  ?Lab 08/01/21 ?0854  ?INR 1.0  ? ? ?Cardiac Enzymes: ?No results for input(s): CKTOTAL, CKMB, CKMBINDEX, TROPONINI in the last 168 hours. ? ?HbA1C: ?No results found for: HGBA1C ? ?CBG: ?Recent Labs  ?Lab 08/01/21 ?2542  ?GLUCAP 160*  ? ? ?Review of Systems:   ?Unable to obtain as pt is encephalopathic/intubated. ? ?Past Medical History:  ?He,  has a past medical history of Schizotaxia.  ? ?Surgical History:  ?No past surgical history on file.  ? ?Social History:  ? reports that he quit smoking about 16 years ago. His smoking use included cigarettes. He has never used smokeless tobacco. He reports that he does not drink alcohol and does not use drugs.  ? ?Family History:  ?His family history is not on file.  ? ?Allergies ?Not on File  ? ?Home Medications  ?Prior to Admission medications   ?Medication Sig Start Date End Date Taking? Authorizing Provider  ?acetaminophen (TYLENOL) 500 MG tablet Take 500 mg by mouth every 6 (six) hours as needed.    [provider]  ?cetirizine (ZYRTEC) 10 MG tablet Take by mouth.    [provider]  ?Cholecalciferol (VITAMIN D3) 125 MCG (5000 UT) CAPS TAKE 1 CAPSULE BY MOUTH ONCE DAILY  *TAKE WITH FOOD* 06/04/21   Berniece Salines, FNP  ?fluPHENAZine (PROLIXIN) 5 MG tablet Take 5 mg by mouth at bedtime. 03/23/21   [provider]  ?hydrOXYzine (ATARAX) 50 MG tablet Take 50 mg by mouth 3 (three) times daily. 12/23/20   [provider]   ?ibuprofen (ADVIL) 600 MG tablet Take 600 mg by mouth every 6 (six) hours as needed.    [provider]  ?Multiple Vitamin (MULTI-VITAMIN) tablet Take 1 tablet by mouth daily.    [provider]  ?omega-3 acid ethyl esters (LOVAZA) 1 g capsule Take by mouth 2 (two) times daily.    [provider]  ?pioglitazone (ACTOS) 30 MG tablet Take 30 mg by mouth daily. 11/22/20   [provider]  ?QUEtiapine (SEROQUEL) 200 MG tablet Take 200 mg by mouth at bedtime. 03/23/21   [provider]  ?risperiDONE (RISPERDAL) 2 MG tablet Take 2 mg by mouth 2 (two) times daily. 03/23/21   [provider]  ?simvastatin (ZOCOR) 40 MG tablet Take 40 mg by mouth at bedtime. 03/23/21   [provider]  ?tamsulosin (FLOMAX) 0.4 MG CAPS capsule Take  0.4 mg by mouth daily. 03/23/21   [provider]  ?  ? ?Critical care time: 35 min.  ? ?Rutherford Guysahul Jaylei Fuerte, PA - C ?[facility] ?For pager details, please see AMION or use Epic chat  ?After 1900, [facility] for cross coverage needs ?08/01/2021, 9:13 PM ? ? ? ? ?

## 2021-08-01 NOTE — ED Notes (Signed)
Called and spoke to [place] at [facility], time patient will be eligible for ED to ED is approximately 1930 ?

## 2021-08-01 NOTE — ED Notes (Signed)
Versed gtt placed on hold for eeg  ?

## 2021-08-01 NOTE — ED Notes (Signed)
EEG in room , pt sats down in the 50's , NRB removed and pt bagged , MD into room to intubate , pt still with a pulses SB on monitor  ?

## 2021-08-01 NOTE — Progress Notes (Signed)
Prelim EEG review negative for seizures. ? ?-- ?Milon Dikes, MD ?Neurologist ?[facility] ?Pager: [phone] ? ?

## 2021-08-01 NOTE — ED Notes (Addendum)
Family contacted and updated on pt transferring to [facility] at this time. Brother reports he is currently in [place] but will be flying back. (No present family to sign for ED transfer)  ?

## 2021-08-01 NOTE — ED Notes (Signed)
Pt placed on NRB for sats to go down into the 80's and slow to return up , MD notified ,  ?

## 2021-08-01 NOTE — Progress Notes (Signed)
Eeg done 

## 2021-08-01 NOTE — ED Provider Notes (Signed)
? ?[facility] ?Provider Note ? ? Event Date/Time  ? First MD Initiated Contact with Patient 08/01/21 [phone]   ?  (approximate) ?History  ?Altered Mental Status ? ?HPI ?Tyrone Little is a 61 y.o. male with a reported past medical history of schizophrenia who presents for an episode of seizure-like activity, slurred speech, aphasia, drooling, and altered mental status.  Per EMS, patient comes from his group home after a witnessed episode of seizure-like activity with associated left-sided facial droop, drooling, slurred speech.  Reportedly, patient's last known normal was at 730 this morning before this episode of seizure-like activity which included shaking and "foaming from the mouth".  Per report, patient is interactive and able to carry on a conversation without difficulty at his baseline as well as is ambulatory.  Since this morning, patient has not been able to walk on his own and EMS note the patient has not been able to lift his left arm.  Patient somnolent but arousable and not answering any questions at this time therefore further history and review of systems are unable to be obtained ?Physical Exam  ?Triage Vital Signs: ?ED Triage Vitals [08/01/21 0846]  ?Enc Vitals Group  ?   BP 119/83  ?   Pulse Rate (!) 59  ?   Resp 20  ?   Temp 99.3 ?F (37.4 ?C)  ?   Temp Source Axillary  ?   SpO2 93 %  ?   Weight   ?   Height   ?   Head Circumference   ?   Peak Flow   ?   Pain Score   ?   Pain Loc   ?   Pain Edu?   ?   Excl. in GC?   ? ?Most recent vital signs: ?Vitals:  ? 08/01/21 2000 08/01/21 2017  ?BP: 128/83 130/80  ?Pulse: (!) 56 60  ?Resp: (!) 21 (!) 21  ?Temp: 99.7 ?F (37.6 ?C) 99.7 ?F (37.6 ?C)  ?SpO2: 98% 98%  ? ?General: Somnolent but arousable ?CV:  Good peripheral perfusion.  ?Resp:  Normal effort.  ?Abd:  No distention.  ?Other:  Middle-aged Caucasian male with minimal responses to questioning, drooling from the mouth.  Minimal response to stimuli. ?ED Results / Procedures / Treatments   ?Labs ?(all labs ordered are listed, but only abnormal results are displayed) ?Labs Reviewed  ?COMPREHENSIVE METABOLIC PANEL - Abnormal; Notable for the following components:  ?    Result Value  ? Glucose, Bld 169 (*)   ? Calcium 8.8 (*)   ? All other components within normal limits  ?URINE DRUG SCREEN, QUALITATIVE ([facility] ONLY) - Abnormal; Notable for the following components:  ? Tricyclic, Ur Screen POSITIVE (*)   ? All other components within normal limits  ?URINALYSIS, ROUTINE W REFLEX MICROSCOPIC - Abnormal; Notable for the following components:  ? Color, Urine YELLOW (*)   ? APPearance CLEAR (*)   ? Specific Gravity, Urine 1.034 (*)   ? All other components within normal limits  ?BLOOD GAS, ARTERIAL - Abnormal; Notable for the following components:  ? pH, Arterial 7.58 (*)   ? pCO2 arterial 24 (*)   ? pO2, Arterial 229 (*)   ? Acid-Base Excess 2.3 (*)   ? All other components within normal limits  ?BLOOD GAS, ARTERIAL - Abnormal; Notable for the following components:  ? pH, Arterial 7.49 (*)   ? pO2, Arterial 150 (*)   ? All other components within normal limits  ?CBG  MONITORING, ED - Abnormal; Notable for the following components:  ? Glucose-Capillary 160 (*)   ? All other components within normal limits  ?RESP PANEL BY RT-PCR (FLU A&B, COVID) ARPGX2  ?CULTURE, BLOOD (ROUTINE X 2)  ?ETHANOL  ?PROTIME-INR  ?APTT  ?CBC  ?DIFFERENTIAL  ?TROPONIN I (HIGH SENSITIVITY)  ?TROPONIN I (HIGH SENSITIVITY)  ? ?EKG ?ED ECG REPORT ?I, Merwyn Katos, the attending physician, personally viewed and interpreted this ECG. ?Date: 08/01/2021 ?EKG Time: 0847 ?Rate: 57 ?Rhythm: normal sinus rhythm ?QRS Axis: normal ?Intervals: normal ?ST/T Wave abnormalities: normal ?Narrative Interpretation: no evidence of acute ischemia ?RADIOLOGY ?ED MD interpretation: One-view portable chest x-ray interpreted by me shows no evidence of acute abnormalities including no pneumonia, pneumothorax, or widened mediastinum ?-Agree with radiology  assessment ?Official radiology report(s): ?DG Chest Port 1 View ? ?Result Date: 08/02/2021 ?CLINICAL DATA:  Respiratory failure. EXAM: PORTABLE CHEST 1 VIEW COMPARISON:  Aug 01, 2021. FINDINGS: Stable cardiomediastinal silhouette. Endotracheal and nasogastric tubes are in grossly good position. Hypoinflation of the lungs are noted with mild bibasilar subsegmental atelectasis. Bony thorax is unremarkable. IMPRESSION: Hypoinflation of the lungs with mild bibasilar subsegmental atelectasis. Electronically Signed   By: Lupita Raider M.D.   On: 08/02/2021 07:58  ? ?Overnight EEG with video ? ?Result Date: 08/02/2021 ?Charlsie Quest, MD     08/02/2021  9:49 AM Patient Name: Tyrone Little MRN: [id number] Epilepsy Attending: Charlsie Quest Referring Physician/Provider: Caryl Pina, MD Duration: 08/01/2021 2226 to 08/02/2021 0945  Patient history: 61 year old with schizophrenia, developmental delay, resident of a group home brought in with what looks like a witnessed seizure.  EEG to evaluate for seizure.  Level of alertness: Lethargic/sedated  AEDs during EEG study: Versed, LEV  Technical aspects: This EEG study was done with scalp electrodes positioned according to the 10-20 International system of electrode placement. Electrical activity was acquired at a sampling rate of 500Hz  and reviewed with a high frequency filter of 70Hz  and a low frequency filter of 1Hz . EEG data were recorded continuously and digitally stored.  Description: EEG showed continuous generalized 3 to 5 Hz theta-delta slowing admixed with an excessive amount of 15 to 18 Hz beta activity  distributed symmetrically and diffusely. Hyperventilation and photic stimulation were not performed.    ABNORMALITY - Continuous slow, generalized - Excessive beta, generalized  IMPRESSION: This study is suggestive of moderate to severe diffuse encephalopathy, nonspecific etiology but likely related to sedation. No seizures or epileptiform discharges were seen throughout the  recording.  Priyanka   ?PROCEDURES: ?Critical Care performed: Yes, see critical care procedure note(s) ?Procedure Name: Intubation ?Date/Time: 08/02/2021 4:15 PM ?Performed by: , MD ?Pre-anesthesia Checklist: Patient identified, Patient being monitored, Emergency Drugs available, Timeout performed and Suction available ?Oxygen Delivery Method: Non-rebreather mask ?Preoxygenation: Pre-oxygenation with 100% oxygen ?Induction Type: Rapid sequence ?Ventilation: Mask ventilation without difficulty ?Laryngoscope Size: Glidescope ?Grade View: Grade I ?Tube size: 8.0 mm ?Number of attempts: 1 ?Airway Equipment and Method: Video-laryngoscopy ?Placement Confirmation: ETT inserted through vocal cords under direct vision, CO2 detector and Breath sounds checked- equal and bilateral ?Secured at: 25 cm ?Tube secured with: ETT holder ?Dental Injury: Teeth and Oropharynx as per pre-operative assessment  ? ? ? ?.1-3 Lead EKG Interpretation ?Performed by: Annabelle Harman, MD ?Authorized by: 10/02/2021, MD  ? ?  Interpretation: normal   ?  ECG rate:  68 ?  ECG rate assessment: normal   ?  Rhythm: sinus rhythm   ?  Ectopy: none   ?  Conduction: normal   ?CRITICAL CARE ?Performed by: Merwyn KatosEvan K Madellyn Denio ? ?Total critical care time: 57 minutes ? ?Critical care time was exclusive of separately billable procedures and treating other patients. ? ?Critical care was necessary to treat or prevent imminent or life-threatening deterioration. ? ?Critical care was time spent personally by me on the following activities: development of treatment plan with patient and/or surrogate as well as nursing, discussions with consultants, evaluation of patient's response to treatment, examination of patient, obtaining history from patient or surrogate, ordering and performing treatments and interventions, ordering and review of laboratory studies, ordering and review of radiographic studies, pulse oximetry and re-evaluation of patient's  condition. ? ?MEDICATIONS ORDERED IN ED: ?Medications  ?iohexol (OMNIPAQUE) 350 MG/ML injection 100 mL (100 mLs Intravenous Contrast Given 08/01/21 0932)  ?LORazepam (ATIVAN) injection 1 mg (1 mg Intravenous G

## 2021-08-02 ENCOUNTER — Encounter (HOSPITAL_COMMUNITY): Payer: Self-pay | Admitting: Pulmonary Disease

## 2021-08-02 ENCOUNTER — Inpatient Hospital Stay (HOSPITAL_COMMUNITY): Payer: Medicare Other

## 2021-08-02 DIAGNOSIS — J189 Pneumonia, unspecified organism: Secondary | ICD-10-CM | POA: Diagnosis not present

## 2021-08-02 DIAGNOSIS — R569 Unspecified convulsions: Principal | ICD-10-CM

## 2021-08-02 DIAGNOSIS — G40901 Epilepsy, unspecified, not intractable, with status epilepticus: Secondary | ICD-10-CM

## 2021-08-02 DIAGNOSIS — Z9911 Dependence on respirator [ventilator] status: Secondary | ICD-10-CM

## 2021-08-02 LAB — GLUCOSE, CAPILLARY
Glucose-Capillary: 111 mg/dL — ABNORMAL HIGH (ref 70–99)
Glucose-Capillary: 141 mg/dL — ABNORMAL HIGH (ref 70–99)
Glucose-Capillary: 90 mg/dL (ref 70–99)
Glucose-Capillary: 81 mg/dL (ref 70–99)
Glucose-Capillary: 96 mg/dL (ref 70–99)

## 2021-08-02 LAB — BLOOD GAS, ARTERIAL
Acid-Base Excess: 2.3 mmol/L — ABNORMAL HIGH (ref 0.0–2.0)
Acid-Base Excess: 1.8 mmol/L (ref 0.0–2.0)
Bicarbonate: 22.5 mmol/L (ref 20.0–28.0)
Bicarbonate: 24.4 mmol/L (ref 20.0–28.0)
FIO2: 100 %
FIO2: 60 %
Mechanical Rate: 20
Mechanical Rate: 500
O2 Saturation: 100 %
O2 Saturation: 100 %
PEEP: 5 cmH2O
PEEP: 5 cmH2O
Patient temperature: 37
Patient temperature: 37
RATE: 14 resp/min
Spontaneous VT: 500 mL
pCO2 arterial: 24 mmHg — ABNORMAL LOW (ref 32–48)
pCO2 arterial: 32 mmHg (ref 32–48)
pH, Arterial: 7.58 — ABNORMAL HIGH (ref 7.35–7.45)
pH, Arterial: 7.49 — ABNORMAL HIGH (ref 7.35–7.45)
pO2, Arterial: 229 mmHg — ABNORMAL HIGH (ref 83–108)
pO2, Arterial: 150 mmHg — ABNORMAL HIGH (ref 83–108)

## 2021-08-02 LAB — HEMOGLOBIN A1C
Hgb A1c MFr Bld: 5.4 % (ref 4.8–5.6)
Mean Plasma Glucose: 108.28 mg/dL

## 2021-08-02 LAB — CBC
HCT: 45.2 % (ref 39.0–52.0)
Hemoglobin: 14.8 g/dL (ref 13.0–17.0)
MCH: 28.7 pg (ref 26.0–34.0)
MCHC: 32.7 g/dL (ref 30.0–36.0)
MCV: 87.8 fL (ref 80.0–100.0)
Platelets: 220 10*3/uL (ref 150–400)
RBC: 5.15 MIL/uL (ref 4.22–5.81)
RDW: 12.6 % (ref 11.5–15.5)
WBC: 10.5 10*3/uL (ref 4.0–10.5)
nRBC: 0 % (ref 0.0–0.2)

## 2021-08-02 LAB — HIV ANTIBODY (ROUTINE TESTING W REFLEX): HIV Screen 4th Generation wRfx: NONREACTIVE

## 2021-08-02 LAB — BASIC METABOLIC PANEL
Anion gap: 11 (ref 5–15)
BUN: 18 mg/dL (ref 6–20)
CO2: 23 mmol/L (ref 22–32)
Calcium: 9.1 mg/dL (ref 8.9–10.3)
Chloride: 105 mmol/L (ref 98–111)
Creatinine, Ser: 1.01 mg/dL (ref 0.61–1.24)
GFR, Estimated: >60 mL/min (ref >60)
Glucose, Bld: 122 mg/dL — ABNORMAL HIGH (ref 70–99)
Potassium: 3.9 mmol/L (ref 3.5–5.1)
Sodium: 139 mmol/L (ref 135–145)

## 2021-08-02 LAB — POCT I-STAT 7, (LYTES, BLD GAS, ICA,H+H)
Acid-Base Excess: 1 mmol/L (ref 0.0–2.0)
Bicarbonate: 26.8 mmol/L (ref 20.0–28.0)
Calcium, Ion: 1.24 mmol/L (ref 1.15–1.40)
HCT: 39 % (ref 39.0–52.0)
Hemoglobin: 13.3 g/dL (ref 13.0–17.0)
O2 Saturation: 99 %
Patient temperature: 98.7
Potassium: 3.6 mmol/L (ref 3.5–5.1)
Sodium: 138 mmol/L (ref 135–145)
TCO2: 28 mmol/L (ref 22–32)
pCO2 arterial: 46.9 mmHg (ref 32–48)
pH, Arterial: 7.365 (ref 7.35–7.45)
pO2, Arterial: 126 mmHg — ABNORMAL HIGH (ref 83–108)

## 2021-08-02 LAB — MAGNESIUM: Magnesium: 1.9 mg/dL (ref 1.7–2.4)

## 2021-08-02 MED ORDER — ATROPINE SULFATE 1 MG/10ML IJ SOSY: 0.5000 mg | PREFILLED_SYRINGE | INTRAMUSCULAR | Status: DC | PRN

## 2021-08-02 MED ORDER — PIPERACILLIN-TAZOBACTAM 3.375 G IVPB
3.3750 g | Freq: Three times a day (TID) | INTRAVENOUS | Status: DC
Start: 2021-08-03 — End: 2021-08-04
  Administered 2021-08-03 – 2021-08-04 (×5): 3.375 g via INTRAVENOUS
  Filled 2021-08-02 (×5): qty 50

## 2021-08-02 MED ORDER — LEVETIRACETAM IN NACL 500 MG/100ML IV SOLN
500.0000 mg | Freq: Two times a day (BID) | INTRAVENOUS | Status: DC
  Administered 2021-08-02 – 2021-08-04 (×5): 500 mg via INTRAVENOUS
  Filled 2021-08-02 (×5): qty 100

## 2021-08-02 MED ORDER — MAGNESIUM SULFATE 2 GM/50ML IV SOLN
2.0000 g | Freq: Once | INTRAVENOUS | Status: AC
  Administered 2021-08-02: 2 g via INTRAVENOUS
  Filled 2021-08-02: qty 50

## 2021-08-02 MED ORDER — LACTATED RINGERS IV BOLUS
1000.0000 mL | Freq: Once | INTRAVENOUS | Status: AC
  Administered 2021-08-02: 1000 mL via INTRAVENOUS

## 2021-08-02 MED ORDER — PIPERACILLIN-TAZOBACTAM 3.375 G IVPB 30 MIN
3.3750 g | Freq: Once | INTRAVENOUS | Status: AC
  Administered 2021-08-02: 3.375 g via INTRAVENOUS
  Filled 2021-08-02: qty 50

## 2021-08-02 NOTE — Progress Notes (Signed)
?  Transition of Care (TOC) Screening Note ? ? ?Patient Details  ?Name: Tyrone Little ?Date of Birth: Feb 24, 1961 ? ? ?Transition of Care [facility]) CM/SW Contact:    ?Glennon Mac, RN ?Phone Number: ?08/02/2021, 1:41 PM ? ? ? ?Transition of Care Department [facility]) has reviewed patient and no TOC needs have been identified at this time. We will continue to monitor patient advancement through interdisciplinary progression rounds. If new patient transition needs arise, please place a TOC consult. ? ?Quintella Baton, RN, BSN  ?Trauma/Neuro ICU Case Manager ?[phone] ? ?

## 2021-08-02 NOTE — Progress Notes (Signed)
eLink Physician-Brief Progress Note ?Patient Name: Tyrone Little ?DOB: 04/02/1960 ?MRN: [id number] ? ? ?Date of Service ? 08/02/2021  ?HPI/Events of Note ? Agitation - Patient intubated and ventilated. Nursing request for bilateral wrist restraints.   ?eICU Interventions ? Will order bilateral soft wrist restraints X 13 hours.   ? ? ? ?Intervention Category ?Major Interventions: Delirium, psychosis, severe agitation - evaluation and management ? ?Gaynelle Pastrana Dennard Nip ?08/02/2021, 8:34 PM ?

## 2021-08-02 NOTE — Assessment & Plan Note (Signed)
Several seizures noted prior to intubation for airway protection. No further clinical seizures since on midazolam infusion.  ? ?- Continue Keppra, Versed. ?- Awaiting LTM EEG.  ?- Maintain seizure control/burst suppression.  ?

## 2021-08-02 NOTE — Assessment & Plan Note (Signed)
Combination of possible intermittent non-convulsive seizure with post ictal period superimposed on decreased baseline mental function.  ?

## 2021-08-02 NOTE — Assessment & Plan Note (Signed)
New onset. Negative CT ? ?- May require MRI ?

## 2021-08-02 NOTE — Progress Notes (Signed)
Subjective: ?No seizures on EEG ? ?Exam: ?Vitals:  ? 08/02/21 1400 08/02/21 1500  ?BP: 131/80 136/77  ?Pulse: (!) 53 (!) 53  ?Resp: 14 14  ?Temp:    ?SpO2: 100% 100%  ? ?Gen: In bed, intubated ? ?Neuro:( sedated with midazolam 5.5) ?MS: Does not open eyes or follow commands ?CN: Pupils reactive bilaterally, blinks to eyelid stimulation bilaterally, doll's intact ?Motor: Minimal flexion versus withdrawal x4 ?Sensory: As above ? ?Pertinent Labs: ?BMP-unremarkable ? ?Impression: 61 year old male with probable seizure yesterday morning.  He has had no further seizures on EEG, I would continue antiepileptic therapy at this time.  He does have schizophrenia, which can be a neurodegenerative condition and therefore I do feel could be a contributor.  There is also question of whether he could have taken the wrong medicines, which would raise the possibility of seizure mimic.  I will continue antiepileptic therapy for now, but not sure that he will need it long-term given that this is a first ever seizure. ? ?Recommendations: ?1) continue EEG as we wean sedation ?2) continue Keppra 500 mg twice daily ?3) neurology will continue to follow ? ?Ritta Slot, MD ?[facility] ?[phone] ? ?If 7pm- 7am, please page neurology on call as listed in AMION. ? ?

## 2021-08-02 NOTE — Assessment & Plan Note (Signed)
Intubated for airway protection and hypoxia, likely from hypoventilation.  ? ?- Mental status will drive timing of extubation, full ventilator support for now.  ?

## 2021-08-02 NOTE — Procedures (Addendum)
Patient Name: Tyrone Little  ?MRN: [id number]  ?Epilepsy Attending: Charlsie Quest  ?Referring Physician/Provider: Caryl Pina, MD ?Duration: 08/01/2021 2226 to 08/02/2021 2226 ?  ?Patient history: 61 year old with schizophrenia, developmental delay, resident of a group home brought in with what looks like a witnessed seizure.  EEG to evaluate for seizure. ?  ?Level of alertness: Lethargic/sedated ?  ?AEDs during EEG study: Versed, LEV ?  ?Technical aspects: This EEG study was done with scalp electrodes positioned according to the 10-20 International system of electrode placement. Electrical activity was acquired at a sampling rate of 500Hz  and reviewed with a high frequency filter of 70Hz  and a low frequency filter of 1Hz . EEG data were recorded continuously and digitally stored.  ?  ?Description: EEG showed continuous generalized 3 to 5 Hz theta-delta slowing admixed with an excessive amount of 15 to 18 Hz beta activity  distributed symmetrically and diffusely. Hyperventilation and photic stimulation were not performed.    ?  ?ABNORMALITY ?- Continuous slow, generalized ?- Excessive beta, generalized ?  ?IMPRESSION: ?This study is suggestive of moderate to severe diffuse encephalopathy, nonspecific etiology but likely related to sedation. No seizures or epileptiform discharges were seen throughout the recording. ?  ?  ?

## 2021-08-02 NOTE — Progress Notes (Signed)
EEG maintenance performed.  No skin breakdown observed. ?

## 2021-08-02 NOTE — Progress Notes (Signed)
? ?NAMERaven Little, MRN:  [id number], DOB:  04/23/1960, LOS: 1 ?ADMISSION DATE:  08/01/2021, CONSULTATION DATE:  5/3 ?REFERRING MD:  Dr. Vicente Males CHIEF COMPLAINT:  Seizure  ? ? ?History of Present Illness:  ?Patient is a 61 yo M w/ pertinent PMH of schizoaffective disorder, developmental delay (baseline able to carry on conversation and is ambulatory) presents to [facility] ED 5/3 with acute new onset seizures. ?  ?On 5/3, patient was at group home. A resident appreciated seizure like activity. LKN 7:30 am 5/3. Patient suddenly started shaking and foaming and drooling from mouth appreciated.  Decreased LOC. Patient brought to [facility] ED by EMS for new onset seizure activity. Code stroke activated for concern w/ slurred sppech and left sided drift. CT head with no acute abnormality. Neuro consulted. Loaded patient with keppra and ativan x1. EEG ordered. Patient initially hemodynamically stable on minimal O2.  Later patient's sats in 50s and was placed on NRB and HR brady 50s. Patient was intubated for airway protection. CXR showing ETT good position; mild atelectasis. Neuro recommended patient be transferred to [facility] for cEEG. PCCM consulted for ICU admission. ?  ?Pertinent Labs: glucose 160, ethanol wnl, cbc/cmp wnl, covid/flu negative, ua negative, UDS positive for tricyclic (on psych meds at home) ? ?Past Medical History:  ?Schizoaffective disorder ?Developmental delay, at baseline can carry a conversation and ambulate ? ? ?Significant Hospital Events:  ?5/3: admitted to [facility] w/ seizure; intubated; transferring to [facility] for cEEG ? ?Interim History / Subjective:  ?Sedated and intubated.  ? ?Objective   ?Blood pressure 131/80, pulse (!) 53, temperature 98.8 ?F (37.1 ?C), resp. rate 14, height 5\' 11"  (1.803 m), weight 104.1 kg, SpO2 100 %. ?   ?Vent Mode: PRVC ?FiO2 (%):  [40 %-60 %] 40 % ?Set Rate:  [14 bmp] 14 bmp ?Vt Set:  [500 mL-600 mL] 600 mL ?PEEP:  [5 cmH20] 5 cmH20 ?Plateau Pressure:  [13 cmH20-16 cmH20] 13 cmH20   ? ?Intake/Output Summary (Last 24 hours) at 08/02/2021 1458 ?Last data filed at 08/02/2021 1400 ?Gross per 24 hour  ?Intake 1704.32 ml  ?Output 400 ml  ?Net 1304.32 ml  ? ?Filed Weights  ? 08/01/21 2111  ?Weight: 104.1 kg  ? ? ?Examination: ?General: resting in bed comfortably, intubated  ?HENT: [place]/AT, sclerae anicteric, ETT in place ?Lungs: CTA b/l, non labored breathing, on mechanical vent ?Cardiovascular: RRR, no murmurs rubs or gallops ?Abdomen: soft, nontender, BS+ ?Extremities: no edema ?Neuro: following simple commands, moving all four extremities ? ?Resolved Hospital Problem list   ? ? ?Assessment & Plan:  ? ?New onset Seizure ?Acute Encephalopathy, resolved ?Was likely postictal, able to follow simple commands and wake for me this afternoon. He has a hx of developmental delay. CT head no acute abnormality, MRI ordered. Ethanol and UA wnl. UDS positive for tricyclic (on psych meds at home). He is following simple commands this morning and moving all four extremities. EEG did not demonstrate any seizures or epileptiform discharges.  ?-Neurology on board, appreciate assistance ?-AEDs per neuro ?-MRI pending ?-frequent neuro checks ?-seizure precautions in place ?  ?Acute respiratory failure w/ hypoxia on MV ?Intubated for airway protection. Following simple commands and moving all 4 extremities. Plan for daily SBT/SAT and wean sedation as tolerated in hopes for extubation. ?-LTVV strategy with tidal volumes of 6-8 cc/kg ideal body weight. ?-Wean PEEP/FiO2 for SpO2 >92% ?-VAP bundle in place ?-Daily SAT and SBT  ?-PAD protocol in place ?-wean sedation for RASS goal 0 to -1 ?  ?  Hx of schizoaffective disorder ?Home medications include seroquel, resperdal, fluphenazine, hydroxyzine.  ?-Consider restarting home medications  ?  ?DM ?A1c 5.4. Home medication includes pioglitazone ?- SSI ?  ?Hx of HLD ?- Continue home statin ?  ?Hx of BPH ?- Hold home flomax until can take PO ?  ? ?Best practice (evaluated daily)  ?Diet:  tube feeds ?Pain/Anxiety/Delirium protocol (if indicated): fentanyl ?VAP protocol (if indicated): yes ?DVT prophylaxis: LMWH ?GI prophylaxis: PPI ?Glucose control: SSI ?Mobility: bedrest ?Disposition:ICU ? ?Goals of Care:  ?Last date of multidisciplinary goals of care discussion:5/4 ?Family and staff present: yes ?Summary of discussion: SAT/SBT as able ?Follow up goals of care discussion due: 5/5 ?Code Status: Full ? ?Labs   ?CBC: ?Recent Labs  ?Lab 08/01/21 ?0854 08/02/21 ?0327 08/02/21 ?0351  ?WBC 6.3  --  10.5  ?NEUTROABS 4.4  --   --   ?HGB 14.3 13.3 14.8  ?HCT 43.7 39.0 45.2  ?MCV 86.0  --  87.8  ?PLT 247  --  220  ? ? ?Basic Metabolic Panel: ?Recent Labs  ?Lab 08/01/21 ?0854 08/02/21 ?0327 08/02/21 ?0351  ?NA 139 138 139  ?K 4.0 3.6 3.9  ?CL 105  --  105  ?CO2 26  --  23  ?GLUCOSE 169*  --  122*  ?BUN 16  --  18  ?CREATININE 0.84  --  1.01  ?CALCIUM 8.8*  --  9.1  ?MG  --   --  1.9  ? ?GFR: ?Estimated Creatinine Clearance: 96.7 mL/min (by C-G formula based on SCr of 1.01 mg/dL). ?Recent Labs  ?Lab 08/01/21 ?0854 08/02/21 ?0351  ?WBC 6.3 10.5  ? ? ?Liver Function Tests: ?Recent Labs  ?Lab 08/01/21 ?0854  ?AST 16  ?ALT 14  ?ALKPHOS 61  ?BILITOT 0.5  ?PROT 6.9  ?ALBUMIN 3.8  ? ?No results for input(s): LIPASE, AMYLASE in the last 168 hours. ?No results for input(s): AMMONIA in the last 168 hours. ? ?ABG ?   ?Component Value Date/Time  ? PHART 7.365 08/02/2021 0327  ? PCO2ART 46.9 08/02/2021 0327  ? PO2ART 126 (H) 08/02/2021 0327  ? HCO3 26.8 08/02/2021 0327  ? TCO2 28 08/02/2021 0327  ? O2SAT 99 08/02/2021 0327  ?  ? ?Coagulation Profile: ?Recent Labs  ?Lab 08/01/21 ?0854  ?INR 1.0  ? ? ?Cardiac Enzymes: ?No results for input(s): CKTOTAL, CKMB, CKMBINDEX, TROPONINI in the last 168 hours. ? ?HbA1C: ?Hgb A1c MFr Bld  ?Date/Time Value Ref Range Status  ?08/02/2021 03:51 AM 5.4 4.8 - 5.6 % Final  ?  Comment:  ?  (NOTE) ?Pre diabetes:          5.7%-6.4% ? ?Diabetes:              >6.4% ? ?Glycemic control for    <7.0% ?adults with diabetes ?  ? ? ?CBG: ?Recent Labs  ?Lab 08/01/21 ?1275 08/01/21 ?2203 08/01/21 ?2351 08/02/21 ?1700 08/02/21 ?1749  ?GLUCAP 160* 149* 147* 111* 141*  ? ? ?Review of Systems:   ?Sedated ? ?Past Medical History:  ?He,  has a past medical history of Schizotaxia.  ? ?Surgical History:  ?No past surgical history on file.  ? ?Social History:  ? reports that he quit smoking about 16 years ago. His smoking use included cigarettes. He has never used smokeless tobacco. He reports that he does not drink alcohol and does not use drugs.  ? ?Family History:  ?His family history is not on file.  ? ?Allergies ?No Known Allergies  ? ?Home  Medications  ?Prior to Admission medications   ?Medication Sig Start Date End Date Taking? Authorizing Provider  ?acetaminophen (TYLENOL) 500 MG tablet Take 500 mg by mouth every 6 (six) hours as needed.    [provider]  ?cetirizine (ZYRTEC) 10 MG tablet Take 10 mg by mouth daily.    [provider]  ?Cholecalciferol (VITAMIN D3) 125 MCG (5000 UT) CAPS TAKE 1 CAPSULE BY MOUTH ONCE DAILY  *TAKE WITH FOOD* ?Patient taking differently: Take 5,000 Units by mouth daily. 06/04/21   Berniece SalinesPender, Julie F, FNP  ?fluPHENAZine (PROLIXIN) 5 MG tablet Take 5 mg by mouth at bedtime. 03/23/21   [provider]  ?hydrOXYzine (ATARAX) 50 MG tablet Take 50 mg by mouth 3 (three) times daily. 12/23/20   [provider]  ?ibuprofen (ADVIL) 600 MG tablet Take 600 mg by mouth every 6 (six) hours as needed.    [provider]  ?lubiprostone (AMITIZA) 24 MCG capsule Take 24 mcg by mouth in the morning and at bedtime. 06/23/21   [provider]  ?Multiple Vitamin (MULTI-VITAMIN) tablet Take 1 tablet by mouth daily.    [provider]  ?omega-3 acid ethyl esters (LOVAZA) 1 g capsule Take by mouth 2 (two) times daily.    [provider]  ?pioglitazone (ACTOS) 30 MG tablet Take 30 mg by mouth daily. 11/22/20   [provider]   ?QUEtiapine (SEROQUEL) 200 MG tablet Take 200 mg by mouth at bedtime. 03/23/21   [provider]  ?risperiDONE (RISPERDAL) 2 MG tablet Take 2 mg by mouth 2 (two) times daily. 03/23/21   [provider]  ?

## 2021-08-02 NOTE — Progress Notes (Signed)
Pharmacy Antibiotic Note ? ?Tyrone Little is a 61 y.o. male admitted on 08/01/2021 with pneumonia.  Pharmacy has been consulted for zosyn dosing. ? ?Pt with developmental delay who was admitted for new onset sz from group home. Zosyn ordered empirically for asp PNA. ? ?Scr 1 ?Plan: ?Zosyn 3.375g IV x1 then q8 ?F/u LOT ? ?Height: 5\' 11"  (180.3 cm) ?Weight: 104.1 kg (229 lb 8 oz) ?IBW/kg (Calculated) : 75.3 ? ?Temp (24hrs), Avg:99.1 ?F (37.3 ?C), Min:98 ?F (36.7 ?C), Max:99.7 ?F (37.6 ?C) ? ?Recent Labs  ?Lab 08/01/21 ?0854 08/02/21 ?0351  ?WBC 6.3 10.5  ?CREATININE 0.84 1.01  ?  ?Estimated Creatinine Clearance: 96.7 mL/min (by C-G formula based on SCr of 1.01 mg/dL).   ? ?No Known Allergies ? ?Antimicrobials this admission: ?5/4 zosyn>> ? ? ?Dose adjustments this admission: ? ? ?Microbiology results: ?MRSA PCR neg ?5/4 resp>> ? ?7/4, PharmD, BCIDP, AAHIVP, CPP ?Infectious Disease Pharmacist ?08/02/2021 7:03 PM ? ? ?

## 2021-08-02 NOTE — Assessment & Plan Note (Signed)
Longstanding disorder. On two anti-psychotics at home.  ? ?- Will need med review.  ?

## 2021-08-03 DIAGNOSIS — R569 Unspecified convulsions: Secondary | ICD-10-CM | POA: Diagnosis not present

## 2021-08-03 LAB — CBC WITH DIFFERENTIAL/PLATELET
Abs Immature Granulocytes: 0.04 10*3/uL (ref 0.00–0.07)
Basophils Absolute: 0 10*3/uL (ref 0.0–0.1)
Basophils Relative: 0 %
Eosinophils Absolute: 0 10*3/uL (ref 0.0–0.5)
Eosinophils Relative: 0 %
HCT: 38.2 % — ABNORMAL LOW (ref 39.0–52.0)
Hemoglobin: 13.2 g/dL (ref 13.0–17.0)
Immature Granulocytes: 0 %
Lymphocytes Relative: 11 %
Lymphs Abs: 1.1 10*3/uL (ref 0.7–4.0)
MCH: 29.4 pg (ref 26.0–34.0)
MCHC: 34.6 g/dL (ref 30.0–36.0)
MCV: 85.1 fL (ref 80.0–100.0)
Monocytes Absolute: 0.8 10*3/uL (ref 0.1–1.0)
Monocytes Relative: 8 %
Neutro Abs: 8 10*3/uL — ABNORMAL HIGH (ref 1.7–7.7)
Neutrophils Relative %: 81 %
Platelets: 212 10*3/uL (ref 150–400)
RBC: 4.49 MIL/uL (ref 4.22–5.81)
RDW: 12.9 % (ref 11.5–15.5)
WBC: 9.9 10*3/uL (ref 4.0–10.5)
nRBC: 0 % (ref 0.0–0.2)

## 2021-08-03 LAB — GLUCOSE, CAPILLARY
Glucose-Capillary: 103 mg/dL — ABNORMAL HIGH (ref 70–99)
Glucose-Capillary: 105 mg/dL — ABNORMAL HIGH (ref 70–99)
Glucose-Capillary: 186 mg/dL — ABNORMAL HIGH (ref 70–99)
Glucose-Capillary: 82 mg/dL (ref 70–99)
Glucose-Capillary: 91 mg/dL (ref 70–99)
Glucose-Capillary: 92 mg/dL (ref 70–99)

## 2021-08-03 LAB — COMPREHENSIVE METABOLIC PANEL
ALT: 17 U/L (ref 0–44)
AST: 12 U/L — ABNORMAL LOW (ref 15–41)
Albumin: 3.1 g/dL — ABNORMAL LOW (ref 3.5–5.0)
Alkaline Phosphatase: 60 U/L (ref 38–126)
Anion gap: 7 (ref 5–15)
BUN: 18 mg/dL (ref 6–20)
CO2: 25 mmol/L (ref 22–32)
Calcium: 8.5 mg/dL — ABNORMAL LOW (ref 8.9–10.3)
Chloride: 107 mmol/L (ref 98–111)
Creatinine, Ser: 1.12 mg/dL (ref 0.61–1.24)
GFR, Estimated: >60 mL/min (ref >60)
Glucose, Bld: 89 mg/dL (ref 70–99)
Potassium: 3.8 mmol/L (ref 3.5–5.1)
Sodium: 139 mmol/L (ref 135–145)
Total Bilirubin: 1.2 mg/dL (ref 0.3–1.2)
Total Protein: 5.7 g/dL — ABNORMAL LOW (ref 6.5–8.1)

## 2021-08-03 MED ORDER — QUETIAPINE FUMARATE 200 MG PO TABS: 200.0000 mg | ORAL_TABLET | Freq: Every day | ORAL | Status: DC

## 2021-08-03 MED ORDER — RISPERIDONE 0.5 MG PO TABS
2.0000 mg | ORAL_TABLET | Freq: Two times a day (BID) | ORAL | Status: DC
  Administered 2021-08-03 – 2021-08-06 (×6): 2 mg via ORAL
  Filled 2021-08-03 (×4): qty 4
  Filled 2021-08-03: qty 1

## 2021-08-03 MED ORDER — DOCUSATE SODIUM 50 MG/5ML PO LIQD: 100.0000 mg | Freq: Two times a day (BID) | ORAL | Status: DC | PRN

## 2021-08-03 MED ORDER — ACETAMINOPHEN 650 MG RE SUPP
650.0000 mg | Freq: Four times a day (QID) | RECTAL | Status: DC | PRN
  Administered 2021-08-03: 650 mg via RECTAL
  Filled 2021-08-03: qty 1

## 2021-08-03 MED ORDER — POLYETHYLENE GLYCOL 3350 17 G PO PACK
17.0000 g | PACK | Freq: Two times a day (BID) | ORAL | Status: DC
  Administered 2021-08-03: 17 g
  Filled 2021-08-03: qty 1

## 2021-08-03 MED ORDER — HYDROXYZINE HCL 25 MG PO TABS: 50.0000 mg | ORAL_TABLET | Freq: Three times a day (TID) | ORAL | Status: DC | PRN

## 2021-08-03 MED ORDER — FLUPHENAZINE HCL 5 MG PO TABS
5.0000 mg | ORAL_TABLET | Freq: Every day | ORAL | Status: DC
  Administered 2021-08-03 – 2021-08-05 (×3): 5 mg via ORAL
  Filled 2021-08-03 (×4): qty 1

## 2021-08-03 MED ORDER — POLYETHYLENE GLYCOL 3350 17 G PO PACK: 17.0000 g | PACK | Freq: Every day | ORAL | Status: DC | PRN

## 2021-08-03 MED ORDER — LORAZEPAM 2 MG/ML IJ SOLN
2.0000 mg | Freq: Once | INTRAMUSCULAR | Status: DC | PRN
Start: 2021-08-03 — End: 2021-08-04

## 2021-08-03 MED ORDER — RISPERIDONE 2 MG PO TABS
2.0000 mg | ORAL_TABLET | Freq: Two times a day (BID) | ORAL | Status: DC
  Administered 2021-08-03: 2 mg
  Filled 2021-08-03 (×2): qty 1

## 2021-08-03 MED ORDER — QUETIAPINE FUMARATE 200 MG PO TABS
200.0000 mg | ORAL_TABLET | Freq: Every day | ORAL | Status: DC
  Administered 2021-08-04 – 2021-08-05 (×3): 200 mg via ORAL
  Filled 2021-08-03 (×3): qty 1

## 2021-08-03 MED ORDER — RISPERIDONE 2 MG PO TABS
2.0000 mg | ORAL_TABLET | Freq: Two times a day (BID) | ORAL | Status: DC
  Filled 2021-08-03: qty 1

## 2021-08-03 MED ORDER — HYDROXYZINE HCL 25 MG PO TABS
50.0000 mg | ORAL_TABLET | Freq: Three times a day (TID) | ORAL | Status: DC | PRN
  Administered 2021-08-04: 50 mg via ORAL
  Filled 2021-08-03: qty 2

## 2021-08-03 MED ORDER — FLUPHENAZINE HCL 5 MG PO TABS
5.0000 mg | ORAL_TABLET | Freq: Every day | ORAL | Status: DC
  Filled 2021-08-03: qty 1

## 2021-08-03 MED ORDER — FLUPHENAZINE HCL 5 MG PO TABS: 5.0000 mg | ORAL_TABLET | Freq: Every day | ORAL | Status: DC

## 2021-08-03 NOTE — Progress Notes (Signed)
Discontinued cEEG study.  Notified [facility] monitoring.  Removed EEG electrodes.  No skin breakdown observed.   ?

## 2021-08-03 NOTE — TOC Initial Note (Signed)
Transition of Care (TOC) - Initial/Assessment Note  ? ? ?Patient Details  ?Name: Tyrone Little ?MRN: [id number] ?Date of Birth: 10-20-1960 ? ?Transition of Care (TOC) CM/SW Contact:    ?Mearl Latin, LCSW ?Phone Number: ?08/03/2021, 3:29 PM ? ?Clinical Narrative:                 ?CSW spoke with patient's Group Home Manager, [facility] She stated that they are residential level, so patient was independent with ambulation without assistive device but also incontinent. She stated she would have to check with their Administrator to see what further care they could provide, including if he were to need oxygen at discharge. CSW will follow therapy recommendations.  ? ?Expected Discharge Plan: Group Home ?Barriers to Discharge: Continued Medical Work up ? ? ?Patient Goals and CMS Choice ?  ?  ?  ? ?Expected Discharge Plan and Services ?Expected Discharge Plan: Group Home ?In-house Referral: Clinical Social Work ?  ?  ?Living arrangements for the past 2 months: Group Home ?                ?  ?  ?  ?  ?  ?  ?  ?  ?  ?  ? ?Prior Living Arrangements/Services ?Living arrangements for the past 2 months: Group Home ?Lives with:: Facility Resident ?Patient language and need for interpreter reviewed:: Yes ?Do you feel safe going back to the place where you live?: Yes      ?Need for Family Participation in Patient Care: Yes (Comment) ?Care giver support system in place?: Yes (comment) ?Current home services:  (incontinent) ?Criminal Activity/Legal Involvement Pertinent to Current Situation/Hospitalization: No - Comment as needed ? ?Activities of Daily Living ?  ?  ? ?Permission Sought/Granted ?Permission sought to share information with : Facility Medical sales representative, Family Supports ?Permission granted to share information with : No ? Share Information with NAME: Clarissa ? Permission granted to share info w AGENCY: Group Home ? Permission granted to share info w Relationship: Group Home  Caregiver ? Permission granted to share info w Contact Information: [phone] ? ?Emotional Assessment ?Appearance:: Appears stated age ?Attitude/Demeanor/Rapport: Unable to Assess ?Affect (typically observed): Unable to Assess ?Orientation: : Oriented to Self, Oriented to Place ?Alcohol / Substance Use: Not Applicable ?Psych Involvement: No (comment) ? ?Admission diagnosis:  Seizures (HCC) [R56.9] ?Patient Active Problem List  ? Diagnosis Date Noted  ? Status epilepticus (HCC) 08/02/2021  ? Seizures (HCC) 08/01/2021  ? Acute respiratory failure with hypoxia (HCC)   ? Encephalopathy acute   ? Hyperlipidemia 09/02/2018  ? Obesity 09/02/2018  ? Schizoaffective disorder (HCC) 09/02/2018  ? Vitamin D deficiency 09/02/2018  ? ?PCP:  Pcp, No ?Pharmacy:   ?[facility] - [place], [place] - [address] ?[address] ?[place] [address] ?Phone: [phone] Fax: [phone] ? ? ? ? ?Social Determinants of Health (SDOH) Interventions ?  ? ?Readmission Risk Interventions ?   ? View : No data to display.  ?  ?  ?  ? ? ? ?

## 2021-08-03 NOTE — Procedures (Addendum)
Patient Name: Tyrone Little  ?MRN: [id number]  ?Epilepsy Attending: Charlsie Quest  ?Referring Physician/Provider: Caryl Pina, MD ?Duration: 08/02/2021 2226 to 08/03/2021 1113 ? ?Patient history: 61 year old with schizophrenia, developmental delay, resident of a group home brought in with what looks like a witnessed seizure.  EEG to evaluate for seizure. ?  ?Level of alertness: Lethargic/sedated ?  ?AEDs during EEG study: Versed, LEV ?  ?Technical aspects: This EEG study was done with scalp electrodes positioned according to the 10-20 International system of electrode placement. Electrical activity was acquired at a sampling rate of 500Hz  and reviewed with a high frequency filter of 70Hz  and a low frequency filter of 1Hz . EEG data were recorded continuously and digitally stored.  ?  ?Description: EEG showed continuous generalized 3 to 5 Hz theta-delta slowing admixed with an excessive amount of 15 to 18 Hz beta activity  distributed symmetrically and diffusely. Hyperventilation and photic stimulation were not performed.    ?  ?ABNORMALITY ?- Continuous slow, generalized ?- Excessive beta, generalized ?  ?IMPRESSION: ?This study is suggestive of moderate to severe diffuse encephalopathy, nonspecific etiology but likely related to sedation. No seizures or epileptiform discharges were seen throughout the recording. ?  ?  ?

## 2021-08-03 NOTE — Progress Notes (Signed)
Subjective: ?No seizures on EEG ? ?Exam: ?Vitals:  ? 08/03/21 0745 08/03/21 0800  ?BP: (!) 154/72 (!) 148/81  ?Pulse: (!) 59 62  ?Resp: 13 17  ?Temp:  99.9 ?F (37.7 ?C)  ?SpO2: 97% 97%  ? ?Gen: In bed, intubated ? ?Neuro:  ?MS: opens eyes and follows commands  ?CN: Pupils reactive bilaterally, VFF, EOMI ?Motor: moves all extremities to command ? ?Pertinent Labs: ?BMP-unremarkable ? ?Impression: 61 year old male with probable seizure needing intubation.  He does have schizophrenia, which can be a neurodegenerative condition and therefore I do feel could be a contributor. There is also question of whether he could have taken the wrong medicines, which would raise the possibility of seizure mimic.  I will continue antiepileptic therapy for now, but not sure that he will need it long-term given that this is a first ever seizure, once extubated and seizures would be more apparent, will stop keppra.  ? ?Recommendations: ?1) continue Keppra 500 mg twice daily for now ?2) neurology will continue to follow ? ?Ritta Slot, MD ?[facility] ?[phone] ? ?If 7pm- 7am, please page neurology on call as listed in AMION. ? ?

## 2021-08-03 NOTE — Progress Notes (Signed)
LTM maint complete - no skin breakdown under: Fp1 F3 A1 Serviced A1 [facility] monitored, Event button test confirmed by [facility].  

## 2021-08-03 NOTE — Progress Notes (Signed)
? ?NAMEJayven Little, MRN:  [id number], DOB:  Aug 04, 1960, LOS: 2 ?ADMISSION DATE:  08/01/2021, CONSULTATION DATE:  5/3 ?REFERRING MD:  Dr. Vicente Males CHIEF COMPLAINT:  Seizure  ? ? ?History of Present Illness:  ?Patient is a 61 yo M w/ pertinent PMH of schizoaffective disorder, developmental delay (baseline able to carry on conversation and is ambulatory) presents to [facility] ED 5/3 with acute new onset seizures. ?  ?On 5/3, patient was at group home. A resident appreciated seizure like activity. LKN 7:30 am 5/3. Patient suddenly started shaking and foaming and drooling from mouth appreciated.  Decreased LOC. Patient brought to [facility] ED by EMS for new onset seizure activity. Code stroke activated for concern w/ slurred sppech and left sided drift. CT head with no acute abnormality. Neuro consulted. Loaded patient with keppra and ativan x1. EEG ordered. Patient initially hemodynamically stable on minimal O2.  Later patient's sats in 50s and was placed on NRB and HR brady 50s. Patient was intubated for airway protection. CXR showing ETT good position; mild atelectasis. Neuro recommended patient be transferred to [facility] for cEEG. PCCM consulted for ICU admission. ?  ?Pertinent Labs: glucose 160, ethanol wnl, cbc/cmp wnl, covid/flu negative, ua negative, UDS positive for tricyclic (on psych meds at home) ? ?Past Medical History:  ?Schizoaffective disorder ?Developmental delay, at baseline can carry a conversation and ambulate ? ? ?Significant Hospital Events:  ?5/3: admitted to [facility] w/ seizure; intubated; transferring to [facility] for cEEG ? ?Interim History / Subjective:  ?Following commands ?Tolerating PSV  ? ?Objective   ?Blood pressure (!) 148/81, pulse 62, temperature 99.9 ?F (37.7 ?C), temperature source Axillary, resp. rate 17, height 5\' 11"  (1.803 m), weight 104.1 kg, SpO2 97 %. ?   ?Vent Mode: PRVC ?FiO2 (%):  [40 %] 40 % ?Set Rate:  [14 bmp] 14 bmp ?Vt Set:  [500 mL-600 mL] 600 mL ?PEEP:  [5 cmH20] 5 cmH20 ?Pressure Support:  [8  cmH20] 8 cmH20 ?Plateau Pressure:  [13 cmH20-15 cmH20] 15 cmH20  ? ?Intake/Output Summary (Last 24 hours) at 08/03/2021 0916 ?Last data filed at 08/03/2021 0800 ?Gross per 24 hour  ?Intake 1554.57 ml  ?Output 900 ml  ?Net 654.57 ml  ? ?Filed Weights  ? 08/01/21 2111  ?Weight: 104.1 kg  ? ? ?Examination: ?General: Critically ill older adult M intubated NAD  ?HENT: NCAT EEG in place ETT secure  ?Lungs: even unlabored on PSV. CTAb  ?Cardiovascular: rrr s1s2 no rgm  ?Abdomen: soft ndnt  ?Extremities:no acute joint deformity no cyanosis or clubbing ?Skin: c/d/w  ?Neuro:Sedated, following commands.  ? ?Resolved Hospital Problem list   ? ? ?Assessment & Plan:  ? ? ?Seizure ?Acute metabolic encephalopathy  ?P ?-wean sedation 5/5 for RASS 0  ?-AEDs per neuro ?-MRI pending ?-seizure precautions in place ? ?Acute respiratory failure with hypoxia  ?P ?-WUA/SBT ?-hopeful extubation 5/5  ?  ?Hx Schizoaffective disorder  ?P ?-restart home meds  ?  ?DM2 ?- SSI ? ?HLD ?- Continue home statin ?  ?Hx of BPH ?- Hold home flomax until can take PO ?  ? ?Best practice (evaluated daily)  ?Diet: tube feeds ?Pain/Anxiety/Delirium protocol (if indicated): fentanyl ?VAP protocol (if indicated): yes ?DVT prophylaxis: LMWH ?GI prophylaxis: PPI ?Glucose control: SSI ?Mobility: bedrest ?Disposition:ICU ? ?Goals of Care:  ?Last date of multidisciplinary goals of care discussion:5/4 ?Code Status: Full ? ?Labs   ?CBC: ?Recent Labs  ?Lab 08/01/21 ?0854 08/02/21 ?0327 08/02/21 ?10/02/21 08/03/21 ?0355  ?WBC 6.3  --  10.5  9.9  ?NEUTROABS 4.4  --   --  8.0*  ?HGB 14.3 13.3 14.8 13.2  ?HCT 43.7 39.0 45.2 38.2*  ?MCV 86.0  --  87.8 85.1  ?PLT 247  --  220 212  ? ? ?Basic Metabolic Panel: ?Recent Labs  ?Lab 08/01/21 ?0854 08/02/21 ?0327 08/02/21 ?4132 08/03/21 ?0355  ?NA 139 138 139 139  ?K 4.0 3.6 3.9 3.8  ?CL 105  --  105 107  ?CO2 26  --  23 25  ?GLUCOSE 169*  --  122* 89  ?BUN 16  --  18 18  ?CREATININE 0.84  --  1.01 1.12  ?CALCIUM 8.8*  --  9.1 8.5*  ?MG  --    --  1.9  --   ? ?GFR: ?Estimated Creatinine Clearance: 87.2 mL/min (by C-G formula based on SCr of 1.12 mg/dL). ?Recent Labs  ?Lab 08/01/21 ?0854 08/02/21 ?0351 08/03/21 ?0355  ?WBC 6.3 10.5 9.9  ? ? ?Liver Function Tests: ?Recent Labs  ?Lab 08/01/21 ?0854 08/03/21 ?0355  ?AST 16 12*  ?ALT 14 17  ?ALKPHOS 61 60  ?BILITOT 0.5 1.2  ?PROT 6.9 5.7*  ?ALBUMIN 3.8 3.1*  ? ?No results for input(s): LIPASE, AMYLASE in the last 168 hours. ?No results for input(s): AMMONIA in the last 168 hours. ? ?ABG ?   ?Component Value Date/Time  ? PHART 7.365 08/02/2021 0327  ? PCO2ART 46.9 08/02/2021 0327  ? PO2ART 126 (H) 08/02/2021 0327  ? HCO3 26.8 08/02/2021 0327  ? TCO2 28 08/02/2021 0327  ? O2SAT 99 08/02/2021 0327  ?  ? ?Coagulation Profile: ?Recent Labs  ?Lab 08/01/21 ?0854  ?INR 1.0  ? ? ?Cardiac Enzymes: ?No results for input(s): CKTOTAL, CKMB, CKMBINDEX, TROPONINI in the last 168 hours. ? ?HbA1C: ?Hgb A1c MFr Bld  ?Date/Time Value Ref Range Status  ?08/02/2021 03:51 AM 5.4 4.8 - 5.6 % Final  ?  Comment:  ?  (NOTE) ?Pre diabetes:          5.7%-6.4% ? ?Diabetes:              >6.4% ? ?Glycemic control for   <7.0% ?adults with diabetes ?  ? ? ?CBG: ?Recent Labs  ?Lab 08/02/21 ?1515 08/02/21 ?1921 08/02/21 ?2324 08/03/21 ?0334 08/03/21 ?0725  ?GLUCAP 90 81 96 92 82  ? ?CRITICAL CARE ?Performed by: Lanier Clam ? ? ?Total critical care time: 36 minutes ? ?Critical care time was exclusive of separately billable procedures and treating other patients. ? ?Critical care was necessary to treat or prevent imminent or life-threatening deterioration. ? ?Critical care was time spent personally by me on the following activities: development of treatment plan with patient and/or surrogate as well as nursing, discussions with consultants, evaluation of patient's response to treatment, examination of patient, obtaining history from patient or surrogate, ordering and performing treatments and interventions, ordering and review of laboratory  studies, ordering and review of radiographic studies, pulse oximetry and re-evaluation of patient's condition. ? ?Tessie Fass MSN, AGACNP-BC ?[facility] ?Amion for pager ?08/03/2021, 11:01 AM ? ?

## 2021-08-03 NOTE — Procedures (Signed)
Extubation Procedure Note ? ?Patient Details:   ?Name: Tyrone Little ?DOB: 1960/05/11 ?MRN: [id number] ?  ?Airway Documentation:  ?  ?Vent end date: 08/03/21 Vent end time: 1226  ? ?Evaluation ? O2 sats: stable throughout ?Complications: No apparent complications ?Patient did tolerate procedure well. ?Bilateral Breath Sounds: Clear, Diminished ?  ?Yes ? ?Pt extubated to 4l [place] with RN, NP, & family at bedside. Positive cuff leak noted and pt tolerated well. RT will monitor. ? ?Forest Becker ?08/03/2021, 12:27 PM ? ?

## 2021-08-04 ENCOUNTER — Inpatient Hospital Stay (HOSPITAL_COMMUNITY): Payer: Medicare Other

## 2021-08-04 DIAGNOSIS — F259 Schizoaffective disorder, unspecified: Secondary | ICD-10-CM

## 2021-08-04 DIAGNOSIS — R4189 Other symptoms and signs involving cognitive functions and awareness: Secondary | ICD-10-CM | POA: Diagnosis not present

## 2021-08-04 DIAGNOSIS — G9341 Metabolic encephalopathy: Secondary | ICD-10-CM

## 2021-08-04 DIAGNOSIS — E876 Hypokalemia: Secondary | ICD-10-CM

## 2021-08-04 DIAGNOSIS — J9601 Acute respiratory failure with hypoxia: Secondary | ICD-10-CM | POA: Diagnosis not present

## 2021-08-04 DIAGNOSIS — R569 Unspecified convulsions: Secondary | ICD-10-CM | POA: Diagnosis not present

## 2021-08-04 LAB — GLUCOSE, CAPILLARY
Glucose-Capillary: 118 mg/dL — ABNORMAL HIGH (ref 70–99)
Glucose-Capillary: 128 mg/dL — ABNORMAL HIGH (ref 70–99)
Glucose-Capillary: 140 mg/dL — ABNORMAL HIGH (ref 70–99)
Glucose-Capillary: 150 mg/dL — ABNORMAL HIGH (ref 70–99)
Glucose-Capillary: 165 mg/dL — ABNORMAL HIGH (ref 70–99)
Glucose-Capillary: 93 mg/dL (ref 70–99)

## 2021-08-04 LAB — COMPREHENSIVE METABOLIC PANEL
ALT: 16 U/L (ref 0–44)
AST: 14 U/L — ABNORMAL LOW (ref 15–41)
Albumin: 3 g/dL — ABNORMAL LOW (ref 3.5–5.0)
Alkaline Phosphatase: 60 U/L (ref 38–126)
Anion gap: 5 (ref 5–15)
BUN: 13 mg/dL (ref 6–20)
CO2: 28 mmol/L (ref 22–32)
Calcium: 8.6 mg/dL — ABNORMAL LOW (ref 8.9–10.3)
Chloride: 109 mmol/L (ref 98–111)
Creatinine, Ser: 0.93 mg/dL (ref 0.61–1.24)
GFR, Estimated: >60 mL/min (ref >60)
Glucose, Bld: 106 mg/dL — ABNORMAL HIGH (ref 70–99)
Potassium: 3.4 mmol/L — ABNORMAL LOW (ref 3.5–5.1)
Sodium: 142 mmol/L (ref 135–145)
Total Bilirubin: 0.7 mg/dL (ref 0.3–1.2)
Total Protein: 6 g/dL — ABNORMAL LOW (ref 6.5–8.1)

## 2021-08-04 LAB — MAGNESIUM: Magnesium: 1.8 mg/dL (ref 1.7–2.4)

## 2021-08-04 LAB — CBC
HCT: 39.3 % (ref 39.0–52.0)
Hemoglobin: 13 g/dL (ref 13.0–17.0)
MCH: 28.6 pg (ref 26.0–34.0)
MCHC: 33.1 g/dL (ref 30.0–36.0)
MCV: 86.6 fL (ref 80.0–100.0)
Platelets: 189 10*3/uL (ref 150–400)
RBC: 4.54 MIL/uL (ref 4.22–5.81)
RDW: 12.3 % (ref 11.5–15.5)
WBC: 6.7 10*3/uL (ref 4.0–10.5)
nRBC: 0 % (ref 0.0–0.2)

## 2021-08-04 MED ORDER — AMOXICILLIN-POT CLAVULANATE 875-125 MG PO TABS
1.0000 | ORAL_TABLET | Freq: Two times a day (BID) | ORAL | Status: DC
  Administered 2021-08-04 – 2021-08-06 (×4): 1 via ORAL
  Filled 2021-08-04 (×4): qty 1

## 2021-08-04 MED ORDER — POTASSIUM CHLORIDE CRYS ER 20 MEQ PO TBCR
40.0000 meq | EXTENDED_RELEASE_TABLET | Freq: Once | ORAL | Status: AC
  Administered 2021-08-04: 40 meq via ORAL
  Filled 2021-08-04: qty 2

## 2021-08-04 MED ORDER — ENOXAPARIN SODIUM 40 MG/0.4ML IJ SOSY
40.0000 mg | PREFILLED_SYRINGE | INTRAMUSCULAR | Status: DC
  Administered 2021-08-04 – 2021-08-05 (×2): 40 mg via SUBCUTANEOUS
  Filled 2021-08-04 (×2): qty 0.4

## 2021-08-04 MED ORDER — ACETAMINOPHEN 325 MG PO TABS: 650.0000 mg | ORAL_TABLET | Freq: Four times a day (QID) | ORAL | Status: DC | PRN

## 2021-08-04 NOTE — Assessment & Plan Note (Addendum)
In the ER, patient appeared to have drooling, decreased mentation and then apnea and hypoxia to the 50%s, likely hypoventilation, intubated in the ER.   ? ?CXRs have been clear, but presume aspiration so started on Zosyn in the ICU.  Still on 2L O2.  Patient now mentating at baseline, taking orals.  Temp < 100? F, heart rate < 100bpm, RR < 24 ?  ?- IS ?- Continue Augmentin ? ?

## 2021-08-04 NOTE — Assessment & Plan Note (Signed)
At baseline, patient is ambulatory, interactive. At presentation, patient was poorly responsive and drooling.  Either post-ictal encephalopathy or less likely toxic encephalopathy. ?

## 2021-08-04 NOTE — Progress Notes (Signed)
?  Progress Note ? ? ?Patient: Tyrone Little [id number] DOB: 29-Mar-1961 DOA: 08/01/2021     3 ?DOS: the patient was seen and examined on 08/04/2021 at 11:44AM ?  ? ? ? ? ?Assessment and Plan: ?* Unresponsive episode ?Agree with Neurology, etiology of event is unclear.  EEG normal, status epilepticus ruled out, although may stil have been a seizure as the initial inciting event. ?- Discontinue Keppra ?- Return for recurrent event ? ?Acute metabolic encephalopathy ?At baseline, patient is ambulatory, interactive. At presentation, patient was poorly responsive and drooling.  Either post-ictal encephalopathy or less likely toxic encephalopathy. ? ?Acute respiratory failure with hypoxia ([place]) ?In the ER, patient appeared to have drooling, decreased mentation and then apnea and hypoxia to the 50%s, likely hypoventilation, intubated in the ER.   ? ?CXRs have been clear, but presume aspiration so started on Zosyn in the ICU.  Still on 2L O2.  Patient now mentating at baseline, taking orals.  heart rate < 100bpm, RR < 24 ?- Wean O2 ?- IS ?- Switch to Augmentin ? ? ?Schizoaffective disorder (Tyrone Little) ?- Continue Prolixin, Seroquel, Risperdal ? ? ? ? ? ? ? ? ? ?Subjective: Last fever yesterday afternoon.  No cough, confusion, vomiting.  No seizure, no pain. ? ? ? ? ?Physical Exam: ?Vitals:  ? 08/04/21 0000 08/04/21 0449 08/04/21 0826 08/04/21 1221  ?BP: (!) 152/88 135/87 (!) 144/70 (!) 143/87  ?Pulse: 61 67 88 72  ?Resp: 18 16 18 20   ?Temp: 100.2 ?F (37.9 ?C) 99.4 ?F (37.4 ?C) 97.7 ?F (36.5 ?C) 98.2 ?F (36.8 ?C)  ?TempSrc: Oral Oral Oral Oral  ?SpO2: 99% 99% 95% 98%  ?Weight:      ?Height:      ? ?Adult male, lying in bed, no acute distress, interactive ?RRR, no murmurs, no peripheral edema ?Lung sounds diminished but no rales or wheezes appreciated ?Abdomen soft no tenderness palpation or guarding, no ascites or distention ?Attention normal, affect normal, oriented to person, place, family, moves upper arms extremity normally, speech  dysarthric at baseline, face symmetric ? ?Data Reviewed: ?Discussed with neurology, critical care notes reviewed, nursing notes reviewed, vital signs reviewed ?Potassium 3.4, supplemented ?Creatinine normal ?Glucose normal ?CBC normal ? ?Family Communication: Brother at the bedside ? ? ? ?Disposition: ?Status is: Inpatient ? ? ? ? ? ? ? ? ?Author: ?Edwin Dada, MD ?08/04/2021 3:29 PM ? ?For on call review [email or web address].  ? ? ?

## 2021-08-04 NOTE — Progress Notes (Signed)
Subjective: ?Patient is back to normal ? ?Exam: ?Vitals:  ? 08/04/21 0449 08/04/21 0826  ?BP: 135/87 (!) 144/70  ?Pulse: 67 88  ?Resp: 16 18  ?Temp: 99.4 ?F (37.4 ?C) 97.7 ?F (36.5 ?C)  ?SpO2: 99% 95%  ? ?Gen: In bed, NAD ?Resp: non-labored breathing, no acute distress ?Abd: soft, nt ? ?Neuro: ?MS: awake, alert, oriented ?FX:JOIT slightly dysconjugate(baseline) VFF ?Motor: moves all extremities well.  ?Sensory:intact to LT ? ?Pertinent Labs: ?Cmp - unremarkable ? ?Impression: 61 yo M with episode of decreased responsiveness of unclear etiology. Seizure is definitely high in the differential, but other possibilities including mistaken medications remain. With no history of any episodes of concern, would not continue antiepileptics unless he has any further spells in the future. With return to baseline, I do not feel MRI is likley to give [place] further answers as even it it shows previous cortical insult, still etiology of presentation would be unclear.  ? ?Recommendations: ?1) Can d/c MRI ?2) d/c keppra ?3) neurology will be available as needed.  ? ?Ritta Slot, MD ?[facility] ?[phone] ? ?If 7pm- 7am, please page neurology on call as listed in AMION. ? ?

## 2021-08-04 NOTE — Evaluation (Signed)
Physical Therapy Evaluation ?Patient Details ?Name: Tyrone Little ?MRN: [id number] ?DOB: 1960/09/25 ?Today's Date: 08/04/2021 ? ?History of Present Illness ? Patient is a 61 yo M presents to [facility] ED 5/3 with acute new onset seizures, transfered to [facility]. Pt was intubated for protection of airway on5/3, extubated 5/5.  PMH:  schizoaffective disorder, developmental delay (baseline able to carry on conversation and is ambulatory) ?  ?Clinical Impression ? Pt very pleasant and cooperative with great spirits. Pt was indep with mobility PTA and went to adult day care 5x/week. Pt now with generalized weakness, deconditioning, and impaired balance requiring use of RW and minA for ADLs due to increased falls risk. Due to developmental delay pt with lack of comprehension of his capabilities and is impulsive trying to do things as he did PTA. Pt able to correct with cues but would need 24/7 assist initially. Per family his group home is unable to provide that level of assist and the patient has to be indep. Recommend ST-SNF upon d/c to achieve safe mod I level of function for safe transition back to group home and adult day care. Acute PT to cont to follow. ? ?SATURATION QUALIFICATIONS: (This note is used to comply with regulatory documentation for home oxygen) ? ?Patient Saturations on Room Air at Rest = 95% ? ?Patient Saturations on Room Air while Ambulating = 93% ? ?Patient Saturations on 0 Liters of oxygen while Ambulating = 93% ? ?Please briefly explain why patient needs home oxygen:  pt doesn't need O2, SpO2 >93% on RA ?   ? ?Recommendations for follow up therapy are one component of a multi-disciplinary discharge planning process, led by the attending physician.  Recommendations may be updated based on patient status, additional functional criteria and insurance authorization. ? ?Follow Up Recommendations Skilled nursing-short term rehab (<3 hours/day) ? ?  ?Assistance Recommended at Discharge Frequent or constant  Supervision/Assistance  ?Patient can return home with the following ? A little help with walking and/or transfers;A little help with bathing/dressing/bathroom;Assistance with cooking/housework;Direct supervision/assist for medications management;Direct supervision/assist for financial management;Assist for transportation ? ?  ?Equipment Recommendations Rolling walker (2 wheels)  ?Recommendations for Other Services ?    ?  ?Functional Status Assessment Patient has had a recent decline in their functional status and demonstrates the ability to make significant improvements in function in a reasonable and predictable amount of time.  ? ?  ?Precautions / Restrictions Precautions ?Precautions: Fall ?Restrictions ?Weight Bearing Restrictions: No  ? ?  ? ?Mobility ? Bed Mobility ?Overal bed mobility: Modified Independent ?  ?  ?  ?  ?  ?  ?General bed mobility comments: HOB elevated, no physical assist needed ?  ? ?Transfers ?Overall transfer level: Needs assistance ?Equipment used: None, Rolling walker (2 wheels) ?Transfers: Sit to/from Stand ?Sit to Stand: Min guard ?  ?  ?  ?  ?  ?General transfer comment: pt quick to move/impulsive, verbal cues for safety, when pt given RW, PT provided verbal cues for safe hand placement as pt never used a RW before ?  ? ?Ambulation/Gait ?Ambulation/Gait assistance: Min assist, Min guard ?Gait Distance (Feet): 60 Feet (x1, 120x1) ?Assistive device: Rolling walker (2 wheels), None ?Gait Pattern/deviations: Step-through pattern, Decreased stride length, Staggering left, Staggering right, Wide base of support ?Gait velocity: impulsively fast, slowed down with walker ?  ?  ?General Gait Details: pt initially ambulated without AD however pt unsteady vearing L/R requiring minA to maitnain balance, modA during turns. pt given RW to trial, pt  reports "I feel more secure" after cues for safe walker management pt with fluid gait pattern, increased stability and able to amb in straight line and  perform turns safely, SPO2 >93% on RA ? ?Stairs ?  ?  ?  ?  ?  ? ?Wheelchair Mobility ?  ? ?Modified Rankin (Stroke Patients Only) ?  ? ?  ? ?Balance Overall balance assessment: Mild deficits observed, not formally tested ?  ?  ?  ?  ?  ?  ?  ?  ?  ?  ?  ?  ?  ?  ?  ?  ?  ?  ?   ? ? ? ?Pertinent Vitals/Pain Pain Assessment ?Pain Assessment: No/denies pain  ? ? ?Home Living Family/patient expects to be discharged to:: Skilled nursing facility ?  ?  ?  ?  ?  ?  ?  ?  ?  ?Additional Comments: pt lives in group home and goes to adult day care during the day, group home unable to take patient back until he is indep  ?  ?Prior Function Prior Level of Function : Needs assist ?  ?  ?  ?  ?  ?  ?Mobility Comments: pt amb without AD PTA, went to adult day care M-F ?ADLs Comments: pt was incontinent, pt indep with dressing and bathing, staff at group home prepared meals, pt fed himself ?  ? ? ?Hand Dominance  ? Dominant Hand: Right ? ?  ?Extremity/Trunk Assessment  ? Upper Extremity Assessment ?Upper Extremity Assessment: Overall WFL for tasks assessed ?  ? ?Lower Extremity Assessment ?Lower Extremity Assessment: Generalized weakness ?  ? ?Cervical / Trunk Assessment ?Cervical / Trunk Assessment: Normal  ?Communication  ? Communication: Expressive difficulties (at times muffled)  ?Cognition Arousal/Alertness: Awake/alert ?Behavior During Therapy: [facility] for tasks assessed/performed ?Overall Cognitive Status: History of cognitive impairments - at baseline ?  ?  ?  ?  ?  ?  ?  ?  ?  ?  ?  ?  ?  ?  ?  ?  ?General Comments: pt with developmental delay but able to have conversation with short answers, pt in good spirits ?  ?  ? ?  ?General Comments General comments (skin integrity, edema, etc.): SpO2 >93% on RA ? ?  ?Exercises    ? ?Assessment/Plan  ?  ?PT Assessment Patient needs continued PT services  ?PT Problem List Decreased strength;Decreased activity tolerance;Decreased balance;Decreased mobility;Decreased knowledge of use of  DME;Decreased safety awareness ? ?   ?  ?PT Treatment Interventions DME instruction;Gait training;Stair training;Functional mobility training;Therapeutic activities;Therapeutic exercise;Balance training   ? ?PT Goals (Current goals can be found in the Care Plan section)  ?Acute Rehab PT Goals ?Patient Stated Goal: didn't state ?PT Goal Formulation: With patient/family ?Time For Goal Achievement: 08/18/21 ?Potential to Achieve Goals: Good ? ?  ?Frequency Min 3X/week ?  ? ? ?Co-evaluation   ?  ?  ?  ?  ? ? ?  ?AM-PAC PT "6 Clicks" Mobility  ?Outcome Measure Help needed turning from your back to your side while in a flat bed without using bedrails?: None ?Help needed moving from lying on your back to sitting on the side of a flat bed without using bedrails?: None ?Help needed moving to and from a bed to a chair (including a wheelchair)?: A Little ?Help needed standing up from a chair using your arms (e.g., wheelchair or bedside chair)?: A Little ?Help needed to walk in hospital room?: A Lot ?  Help needed climbing 3-5 steps with a railing? : A Lot ?6 Click Score: 18 ? ?  ?End of Session Equipment Utilized During Treatment: Gait belt ?Activity Tolerance: Patient tolerated treatment well ?Patient left: in chair;with call bell/phone within reach;with chair alarm set;with family/visitor present;with nursing/sitter in room ?Nurse Communication: Mobility status ?PT Visit Diagnosis: Unsteadiness on feet (R26.81);Muscle weakness (generalized) (M62.81);Difficulty in walking, not elsewhere classified (R26.2) ?  ? ?Time: [id number] ?PT Time Calculation (min) (ACUTE ONLY): 33 min ? ? ?Charges:   PT Evaluation ?$PT Eval Moderate Complexity: 1 Mod ?PT Treatments ?$Gait Training: 8-22 mins ?  ?   ? ? ?Kittie Plater, PT, DPT ?Acute Rehabilitation Services ?Secure chat preferred ?Office #: [phone] ? ? ?Felisia Balcom M Edmundo Tedesco ?08/04/2021, 1:45 PM ? ?

## 2021-08-04 NOTE — Evaluation (Signed)
Occupational Therapy Evaluation ?Patient Details ?Name: Tyrone Little ?MRN: [id number] ?DOB: 12-26-1960 ?Today's Date: 08/04/2021 ? ? ?History of Present Illness Patient is a 61 yo M [facility] ED [facility] with acute new onset seizures, [facility]. Pt was intubated for protection of airway on5/3, extubated 5/5.  PMH:  schizoaffective disorder, developmental delay (baseline able to carry on conversation and is ambulatory)  ? ?Clinical Impression ?  ?Tyrone Little was evaluated s/p the above admission list. He attends adult day care M-F, ambulates independently, is incontinent but otherwise is indep with ADLs. Pt pleasant and cooperative throughout, no family present so unsure what baseline cognition is. He required cues fro safety and impulsivity, min A for mobility and balance without AD, and up to mod A for ADLs. He will benefit from OT acutely. Recommend short term SNF stay to progress towards indep baseline prior to d/c back to his group home.  ?   ? ?Recommendations for follow up therapy are one component of a multi-disciplinary discharge planning process, led by the attending physician.  Recommendations may be updated based on patient status, additional functional criteria and insurance authorization.  ? ?Follow Up Recommendations ? Skilled nursing-short term rehab (<3 hours/day)  ?  ?Assistance Recommended at Discharge Frequent or constant Supervision/Assistance  ?Patient can return home with the following A Little help with walking and/or transfers;A Little help with bathing/dressing/bathroom;Assistance with cooking/housework;Assistance with feeding;Direct supervision/assist for medications management;Assist for transportation;Help with stairs or ramp for entrance ? ?  ?Functional Status Assessment ? Patient has had a recent decline in their functional status and demonstrates the ability to make significant improvements in function in a reasonable and predictable amount of time.  ?Equipment Recommendations ? Other  (comment) (RW)  ?  ?Recommendations for Other Services   ? ? ?  ?Precautions / Restrictions Precautions ?Precautions: Fall ?Restrictions ?Weight Bearing Restrictions: No  ? ?  ? ?Mobility Bed Mobility ?Overal bed mobility: Modified Independent ?  ?  ?  ?  ?  ?  ?  ?  ? ?Transfers ?Overall transfer level: Needs assistance ?Equipment used: None ?Transfers: Sit to/from Stand ?Sit to Stand: Min guard ?  ?  ?  ?  ?  ?General transfer comment: quick to move, impulsive. ambulated without device. 2x LOB needing min A to correct. Recommend RW ?  ? ?  ?Balance Overall balance assessment: Mild deficits observed, not formally tested ?  ?  ?  ?  ?  ?  ?  ?  ?  ?  ?  ?  ?  ?  ?  ?  ?  ?  ?   ? ?ADL either performed or assessed with clinical judgement  ? ?ADL Overall ADL's : Needs assistance/impaired ?Eating/Feeding: Set up;Sitting ?  ?Grooming: Minimal assistance;Standing ?  ?Upper Body Bathing: Minimal assistance;Sitting ?  ?Lower Body Bathing: Moderate assistance;Sit to/from stand ?Lower Body Bathing Details (indicate cue type and reason): to be thorough ?Upper Body Dressing : Set up;Sitting ?  ?Lower Body Dressing: Minimal assistance;Sit to/from stand ?  ?Toilet Transfer: Minimal assistance;Ambulation ?Toilet Transfer Details (indicate cue type and reason): unsteady with 2x LOB ?Toileting- Clothing Manipulation and Hygiene: Moderate assistance;Sit to/from stand ?  ?  ?  ?Functional mobility during ADLs: Minimal assistance ?General ADL Comments: likely near baseline - quite unsteady with 2x LOB. Pt with urgent need to urinate after mobility & pulled male purwick off despite cues  ? ? ? ?Vision Baseline Vision/History: 1 Wears glasses ?Ability to See in Adequate  Light: 1 Impaired ?Vision Assessment?: Vision impaired- to be further tested in functional context  ?   ?Perception   ?  ?Praxis   ?  ? ?Pertinent Vitals/Pain Pain Assessment ?Pain Assessment: No/denies pain  ? ? ? ?Hand Dominance Right ?  ?Extremity/Trunk Assessment  Upper Extremity Assessment ?Upper Extremity Assessment: Generalized weakness ?  ?Lower Extremity Assessment ?Lower Extremity Assessment: Defer to PT evaluation ?  ?Cervical / Trunk Assessment ?Cervical / Trunk Assessment: Normal ?  ?Communication Communication ?Communication: Expressive difficulties ?  ?Cognition Arousal/Alertness: Awake/alert ?Behavior During Therapy: [facility] for tasks assessed/performed ?Overall Cognitive Status: History of cognitive impairments - at baseline ?  ?  ?  ?  ?  ?  ?  ?  ?  ?  ?  ?  ?  ?  ?  ?  ?General Comments: pt with developmental delay but able to have conversation with short answers, pt in good spirits ?  ?  ?General Comments  VSS onRA ? ?  ?Exercises   ?  ?Shoulder Instructions    ? ? ?Home Living Family/patient expects to be discharged to:: Skilled nursing facility ?  ?  ?  ?  ?  ?  ?  ?  ?  ?  ?  ?  ?  ?  ?  ?  ?Additional Comments: pt lives in group home and goes to adult day care during the day, group home unable to take patient back until he is indep ?  ? ?  ?Prior Functioning/Environment Prior Level of Function : Needs assist ?  ?  ?  ?  ?  ?  ?Mobility Comments: pt amb without AD PTA, went to adult day care M-F ?ADLs Comments: pt was incontinent, pt indep with dressing and bathing, staff at group home prepared meals, pt fed himself ?  ? ?  ?  ?OT Problem List: Decreased strength;Decreased activity tolerance;Impaired balance (sitting and/or standing) ?  ?   ?OT Treatment/Interventions: Self-care/ADL training;Therapeutic exercise;Balance training;DME and/or AE instruction;Therapeutic activities  ?  ?OT Goals(Current goals can be found in the care plan section) Acute Rehab OT Goals ?Patient Stated Goal: back to group home ?OT Goal Formulation: With patient ?Time For Goal Achievement: 08/18/21 ?Potential to Achieve Goals: Good ?ADL Goals ?Pt Will Perform Grooming: with modified independence;standing ?Pt Will Perform Lower Body Bathing: with supervision;sit to/from stand ?Pt Will  Perform Lower Body Dressing: with supervision;sit to/from stand ?Pt Will Transfer to Toilet: with modified independence;ambulating ?Pt Will Perform Toileting - Clothing Manipulation and hygiene: with modified independence;sitting/lateral leans  ?OT Frequency: Min 2X/week ?  ? ?Co-evaluation   ?  ?  ?  ?  ? ?  ?AM-PAC OT "6 Clicks" Daily Activity     ?Outcome Measure Help from another person eating meals?: A Little ?Help from another person taking care of personal grooming?: A Little ?Help from another person toileting, which includes using toliet, bedpan, or urinal?: A Lot ?Help from another person bathing (including washing, rinsing, drying)?: A Lot ?Help from another person to put on and taking off regular upper body clothing?: A Little ?Help from another person to put on and taking off regular lower body clothing?: A Lot ?6 Click Score: 15 ?  ?End of Session Equipment Utilized During Treatment: Gait belt ?Nurse Communication: Mobility status (pt pulled purwick off) ? ?Activity Tolerance: Patient tolerated treatment well ?Patient left: in bed;with call bell/phone within reach;with bed alarm set ? ?OT Visit Diagnosis: Unsteadiness on feet (R26.81);Muscle weakness (generalized) (M62.81)  ?              ?  Time: 1610-1630 ?OT Time Calculation (min): 20 min ?Charges:  OT General Charges ?$OT Visit: 1 Visit ?OT Evaluation ?$OT Eval Moderate Complexity: 1 Mod ? ? ?Vineta Carone A Marlene Beidler ?08/04/2021, 4:32 PM ?

## 2021-08-04 NOTE — Assessment & Plan Note (Addendum)
Some mild hallucinations auditory overnight.  At present appears benign. Suspect this was due to missed doses of antipsychotics on admission ?- Continue Prolixin, Seroquel, Risperdal ?

## 2021-08-04 NOTE — Assessment & Plan Note (Addendum)
Agree with Neurology, etiology of event is unclear.  EEG normal, status epilepticus ruled out, although may stil have been a seizure as the initial inciting event. ?  ?

## 2021-08-04 NOTE — Progress Notes (Signed)
eLink Physician-Brief Progress Note ?Patient Name: Tyrone Little ?DOB: 05-Dec-1960 ?MRN: [id number] ? ? ?Date of Service ? 08/04/2021  ?HPI/Events of Note ? Fever to 100.2 F. Nursing request to change Tylenol from suppository to PO. AST and ALT both normal.   ?eICU Interventions ? Will change to Tylenol 650 mg PO Q 6 hours PRN Temp > 101.0 F.   ? ? ? ?Intervention Category ?Major Interventions: Other: ? ?[name] Dennard Nip ?08/04/2021, 1:09 AM ?

## 2021-08-04 NOTE — Hospital Course (Signed)
Tyrone Little is a 61 y.o. male past medical history of schizophrenia, developmental delay, resident of a group home, brought in for evaluation of sudden onset of decreased level of consciousness after he started having shaking/seizure-like activity followed by drooling from his mouth this morning. His last known well was around 7:30 AM when he was having breakfast after that he sat on the couch waiting for his boss to take him to his daycare from the group home and it was noted that he suddenly started shaking with foaming at his mouth and drooling.  EMS was called.  They brought him to the emergency room.  On initial evaluation, exam was nonfocal but on the ED provider evaluation again, he was noted to have some slurred speech and left-sided drift for which a code stroke was activated. Patient unable to provide reliable history. I called the brother and spoke with him-he is the legal guardian. Spoke with someone at the facility who confirmed that he woke up this morning, had breakfast and while sitting on the couch had the episode described above.  Not sure if he has any history of seizures in the past ?

## 2021-08-05 DIAGNOSIS — J690 Pneumonitis due to inhalation of food and vomit: Secondary | ICD-10-CM | POA: Diagnosis not present

## 2021-08-05 DIAGNOSIS — R4189 Other symptoms and signs involving cognitive functions and awareness: Secondary | ICD-10-CM | POA: Diagnosis not present

## 2021-08-05 DIAGNOSIS — G9341 Metabolic encephalopathy: Secondary | ICD-10-CM | POA: Diagnosis not present

## 2021-08-05 DIAGNOSIS — J9601 Acute respiratory failure with hypoxia: Secondary | ICD-10-CM | POA: Diagnosis not present

## 2021-08-05 LAB — GLUCOSE, CAPILLARY
Glucose-Capillary: 120 mg/dL — ABNORMAL HIGH (ref 70–99)
Glucose-Capillary: 127 mg/dL — ABNORMAL HIGH (ref 70–99)
Glucose-Capillary: 130 mg/dL — ABNORMAL HIGH (ref 70–99)
Glucose-Capillary: 150 mg/dL — ABNORMAL HIGH (ref 70–99)
Glucose-Capillary: 154 mg/dL — ABNORMAL HIGH (ref 70–99)
Glucose-Capillary: 98 mg/dL (ref 70–99)

## 2021-08-05 NOTE — TOC Initial Note (Signed)
Transition of Care (TOC) - Initial/Assessment Note  ? ? ?Patient Details  ?Name: Tyrone Little ?MRN: [id number] ?Date of Birth: Jul 26, 1960 ? ?Transition of Care (TOC) CM/SW Contact:    ?Lawerance Sabal, RN ?Phone Number: ?08/05/2021, 1:29 PM ? ?Clinical Narrative:            ?RW to be delivered to room today, [facility] services have set up with [facility].       ? ? ?Expected Discharge Plan: Home w Home Health Services ?Barriers to Discharge: Continued Medical Work up ? ? ?Patient Goals and CMS Choice ?  ?  ?  ? ?Expected Discharge Plan and Services ?Expected Discharge Plan: Home w Home Health Services ?In-house Referral: Clinical Social Work ?Discharge Planning Services: CM Consult ?  ?Living arrangements for the past 2 months: Group Home ?                ?DME Arranged: Walker rolling ?DME Agency: [employer] ?Date DME Agency Contacted: 08/05/21 ?Time DME Agency Contacted: 1329 ?Representative spoke with at DME Agency: Leavy Cella ?HH Arranged: PT ?HH Agency: [facility] ?Date HH Agency Contacted: 08/05/21 ?Time HH Agency Contacted: 1329 ?Representative spoke with at [facility] Agency: Barbara Cower ? ?Prior Living Arrangements/Services ?Living arrangements for the past 2 months: Group Home ?Lives with:: Facility Resident ?Patient language and need for interpreter reviewed:: Yes ?Do you feel safe going back to the place where you live?: Yes      ?Need for Family Participation in Patient Care: Yes (Comment) ?Care giver support system in place?: Yes (comment) ?Current home services:  (incontinent) ?Criminal Activity/Legal Involvement Pertinent to Current Situation/Hospitalization: No - Comment as needed ? ?Activities of Daily Living ?  ?  ? ?Permission Sought/Granted ?Permission sought to share information with : Facility Medical sales representative, Family Supports ?Permission granted to share information with : No ? Share Information with NAME: Clarissa ? Permission granted to share info w AGENCY: Group Home ? Permission granted to  share info w Relationship: Group Home Caregiver ? Permission granted to share info w Contact Information: [phone] ? ?Emotional Assessment ?Appearance:: Appears stated age ?Attitude/Demeanor/Rapport: Unable to Assess ?Affect (typically observed): Unable to Assess ?Orientation: : Oriented to Self, Oriented to Place ?Alcohol / Substance Use: Not Applicable ?Psych Involvement: No (comment) ? ?Admission diagnosis:  Seizures (HCC) [R56.9] ?Patient Active Problem List  ? Diagnosis Date Noted  ? Aspiration pneumonia (HCC) 08/05/2021  ? Hypokalemia 08/04/2021  ? Unresponsive episode 08/01/2021  ? Acute respiratory failure with hypoxia (HCC)   ? Acute metabolic encephalopathy   ? Hyperlipidemia 09/02/2018  ? Obesity 09/02/2018  ? Schizoaffective disorder (HCC) 09/02/2018  ? Vitamin D deficiency 09/02/2018  ? ?PCP:  Pcp, No ?Pharmacy:   ?[facility] [place] [place] - [address] ?[address] ?[place] [address] ?Phone: [phone] Fax: [phone] ? ? ? ? ?Social Determinants of Health (SDOH) Interventions ?  ? ?Readmission Risk Interventions ?   ? View : No data to display.  ?  ?  ?  ? ? ? ?

## 2021-08-05 NOTE — Progress Notes (Addendum)
SW spoke to pt's group home [facility]) manager Josiah Lobo [phone] re PT/OT recommendation for SNF. Pt was able to ambulate with Min assist/Min guard 60' x1 and 120' x1 with use of a RW. Clarissa confirmed they are able to accept pt at current functional status. Discussed HH needs and Clarissa reports they do not have a preferred agency. CM to assist with HH and RW. Pt is on room air at this time.  ? ?Clarissa confirmed they are able to accept pt Monday and will provide transport. MD updated. SW will follow.  ? ?Dellie Burns, MSW, LCSW ?[phone] (coverage) ? ? ?

## 2021-08-05 NOTE — Assessment & Plan Note (Signed)
Resolved with supplementation and starting spironolactone. 

## 2021-08-05 NOTE — Progress Notes (Signed)
?  Progress Note ? ? ?Patient: Tyrone Little KXF:[id number] DOB: 08/21/1960 DOA: 08/01/2021     4 ?DOS: the patient was seen and examined on 08/05/2021 at 1052AM ?  ? ? ? ?Brief hospital course: ?Tyrone Little is a 61 y.o. M with schizophrenia, developmental delay, resident of a group home, brought in for evaluation of sudden onset of decreased level of consciousness after he started having shaking/seizure-like activity followed by drooling from his mouth    ? ?On initial evaluation, exam was nonfocal but EDP noted on return evaluation that he had some slurred speech and left-sided drift for which a code stroke was activated.  Neurology consulted.  Subsequent CT head normal.   ? ? ? ? ?Assessment and Plan: ?* Unresponsive episode ?Agree with Neurology, etiology of event is unclear.  EEG normal, status epilepticus ruled out, although may stil have been a seizure as the initial inciting event. ?  ? ?Hypokalemia ?- Supplement K ? ?Acute metabolic encephalopathy ?At baseline, patient is ambulatory, interactive. At presentation, patient was poorly responsive and drooling.  Either post-ictal encephalopathy or less likely toxic encephalopathy. ? ?Acute respiratory failure with hypoxia due to possible aspiration pneumonia ?In the ER, patient appeared to have drooling, decreased mentation and then apnea and hypoxia to the 50%s, likely hypoventilation, intubated in the ER.   ? ?CXRs have been clear, but presume aspiration so started on Zosyn in the ICU.  Still on 2L O2.  Patient now mentating at baseline, taking orals.  Temp < 100? F, heart rate < 100bpm, RR < 24 ?  ?- IS ?- Continue Augmentin ? ? ?Schizoaffective disorder (HCC) ?Some mild hallucinations auditory overnight.  At present appears benign. Suspect this was due to missed doses of antipsychotics on admission ?- Continue Prolixin, Seroquel, Risperdal ? ? ? ? ? ? ? ? ? ?Subjective: Had some mild auditory hallucination  overnight, feeling well this morning, and cooperative and  pleasant.  No fever, dyspnea, cough, respiratory distress. ? ? ? ? ?Physical Exam: ?Vitals:  ? 08/04/21 2329 08/05/21 0334 08/05/21 0722 08/05/21 1200  ?BP: (!) 113/57 129/73 (!) 164/102 (!) 146/88  ?Pulse: 60 70 98 73  ?Resp: 19 20 19 18   ?Temp: 98.8 ?F (37.1 ?C) 98.7 ?F (37.1 ?C) 99.4 ?F (37.4 ?C) 98.9 ?F (37.2 ?C)  ?TempSrc:  Oral Oral Oral  ?SpO2: 94% 92% 92% 94%  ?Weight:      ?Height:      ? ?Adult male, congenital developmental delay, lying in bed, pleasant, interactive ?RRR, no murmurs, no peripheral edema ?Respiratory rate normal, lung sounds slightly diminished but no rales or wheezes appreciated ?Abdomen soft without tenderness palpation or decidedly distention ?Attention normal to speech, interacting appropriately, oriented to person, place, family, upper extremity strength normal, speech dysarthric at baseline, face symmetric ? ? ? ?Data Reviewed: ?Nursing notes reviewed, vital signs reviewed ? ?Family Communication: Caregiver at the bedside ? ? ? ?Disposition: ?Status is: Inpatient ?Patient is medically ready for discharge, does not have a safe disposition until tomorrow. ? ? ? ? ? ? ? ?Author: ? , MD ?08/05/2021 12:29 PM ? ?For on call review [email or web address].  ? ? ?

## 2021-08-06 DIAGNOSIS — G9341 Metabolic encephalopathy: Secondary | ICD-10-CM | POA: Diagnosis not present

## 2021-08-06 DIAGNOSIS — E876 Hypokalemia: Secondary | ICD-10-CM

## 2021-08-06 DIAGNOSIS — J690 Pneumonitis due to inhalation of food and vomit: Secondary | ICD-10-CM | POA: Diagnosis not present

## 2021-08-06 DIAGNOSIS — R4189 Other symptoms and signs involving cognitive functions and awareness: Secondary | ICD-10-CM | POA: Diagnosis not present

## 2021-08-06 DIAGNOSIS — J9601 Acute respiratory failure with hypoxia: Secondary | ICD-10-CM | POA: Diagnosis not present

## 2021-08-06 LAB — GLUCOSE, CAPILLARY
Glucose-Capillary: 108 mg/dL — ABNORMAL HIGH (ref 70–99)
Glucose-Capillary: 98 mg/dL (ref 70–99)

## 2021-08-06 LAB — CULTURE, BLOOD (ROUTINE X 2)
Culture: NO GROWTH
Special Requests: ADEQUATE

## 2021-08-06 MED ORDER — AMOXICILLIN-POT CLAVULANATE 875-125 MG PO TABS
1.0000 | ORAL_TABLET | Freq: Two times a day (BID) | ORAL | 0 refills | Status: DC
Start: 2021-08-06 — End: 2021-08-26

## 2021-08-06 NOTE — Care Management Important Message (Signed)
Important Message ? ?Patient Details  ?Name: Rossi Burdo ?MRN: [id number] ?Date of Birth: July 21, 1960 ? ? ?Medicare Important Message Given:  Yes ? ? ?Patient left prior to IM delivery will mail to the patient home address.  ? ?Janet Decesare ?08/06/2021, 2:55 PM ?

## 2021-08-06 NOTE — Progress Notes (Signed)
Occupational Therapy Treatment ?Patient Details ?Name: Tyrone Little ?MRN: [id number] ?DOB: 06-02-1960 ?Today's Date: 08/06/2021 ? ? ?History of present illness Patient is a 61 yo M presents to [facility] ED 5/3 with acute new onset seizures, transfered to [facility]. Pt was intubated for protection of airway on5/3, extubated 5/5.  PMH:  schizoaffective disorder, developmental delay (baseline able to carry on conversation and is ambulatory) ?  ?OT comments ? Patient progressing well towards OT goals.  Requires minimal redirection today for safety and recall of RW use, but overall completing ADLs, transfers and mobility with min guard assist.  Per chart plan to go back to group home today, updated dc plan to [facility] services. Will follow acutely.   ? ?Recommendations for follow up therapy are one component of a multi-disciplinary discharge planning process, led by the attending physician.  Recommendations may be updated based on patient status, additional functional criteria and insurance authorization. ?   ?Follow Up Recommendations ? Home health OT  ?  ?Assistance Recommended at Discharge Frequent or constant Supervision/Assistance  ?Patient can return home with the following ? A little help with walking and/or transfers;A little help with bathing/dressing/bathroom;Assistance with cooking/housework;Assistance with feeding;Direct supervision/assist for medications management;Assist for transportation;Help with stairs or ramp for entrance ?  ?Equipment Recommendations ? Other (comment) (RW)  ?  ?Recommendations for Other Services   ? ?  ?Precautions / Restrictions Precautions ?Precautions: Fall ?Restrictions ?Weight Bearing Restrictions: No  ? ? ?  ? ?Mobility Bed Mobility ?Overal bed mobility: Modified Independent ?  ?  ?  ?  ?  ?  ?  ?  ? ?Transfers ?  ?  ?  ?  ?  ?  ?  ?  ?  ?  ?  ?  ?Balance Overall balance assessment: Mild deficits observed, not formally tested ?  ?  ?  ?  ?  ?  ?  ?  ?  ?  ?  ?  ?  ?  ?  ?  ?  ?  ?   ? ?ADL either  performed or assessed with clinical judgement  ? ?ADL Overall ADL's : Needs assistance/impaired ?  ?  ?Grooming: Wash/dry hands;Oral care;Min guard;Standing ?  ?  ?  ?  ?  ?  ?  ?Lower Body Dressing: Min guard;Sit to/from stand ?Lower Body Dressing Details (indicate cue type and reason): doffs/dons socks with independence, min guard sit to stand ?Toilet Transfer: Min guard;Ambulation;Rolling walker (2 wheels) ?Toilet Transfer Details (indicate cue type and reason): simulated in room to recliner ?  ?  ?  ?  ?Functional mobility during ADLs: Min guard;Rolling walker (2 wheels);Cueing for sequencing;Cueing for safety ?General ADL Comments: min cueing to use RW, forgetful when focused on another task ?  ? ?Extremity/Trunk Assessment   ?  ?  ?  ?  ?  ? ?Vision   ?Additional Comments: able to locate items at sink, navigate room with avoiding obstacles. Continue assessment. ?  ?Perception   ?  ?Praxis   ?  ? ?Cognition Arousal/Alertness: Awake/alert ?Behavior During Therapy: [facility] for tasks assessed/performed ?Overall Cognitive Status: History of cognitive impairments - at baseline ?  ?  ?  ?  ?  ?  ?  ?  ?  ?  ?  ?  ?  ?  ?  ?  ?General Comments: pt requires cueing for safety due to impulisve and forgetful with using RW, cueing to sequence ADLs at sink.  Hx of cognitive deficits at baseline ?  ?  ?   ?  Exercises   ? ?  ?Shoulder Instructions   ? ? ?  ?General Comments    ? ? ?Pertinent Vitals/ Pain       Pain Assessment ?Pain Assessment: No/denies pain ? ?Home Living   ?  ?  ?  ?  ?  ?  ?  ?  ?  ?  ?  ?  ?  ?  ?  ?  ?  ?  ? ?  ?Prior Functioning/Environment    ?  ?  ?  ?   ? ?Frequency ? Min 2X/week  ? ? ? ? ?  ?Progress Toward Goals ? ?OT Goals(current goals can now be found in the care plan section) ? Progress towards OT goals: Progressing toward goals ? ?Acute Rehab OT Goals ?Patient Stated Goal: back to group home ?OT Goal Formulation: With patient ?Time For Goal Achievement: 08/18/21 ?Potential to Achieve Goals: Good   ?Plan Frequency remains appropriate;Discharge plan needs to be updated   ? ?Co-evaluation ? ? ?   ?  ?  ?  ?  ? ?  ?AM-PAC OT "6 Clicks" Daily Activity     ?Outcome Measure ? ? Help from another person eating meals?: A Little ?Help from another person taking care of personal grooming?: A Little ?Help from another person toileting, which includes using toliet, bedpan, or urinal?: A Little ?Help from another person bathing (including washing, rinsing, drying)?: A Little ?Help from another person to put on and taking off regular upper body clothing?: A Little ?Help from another person to put on and taking off regular lower body clothing?: A Little ?6 Click Score: 18 ? ?  ?End of Session Equipment Utilized During Treatment: Gait belt;Rolling walker (2 wheels) ? ?OT Visit Diagnosis: Unsteadiness on feet (R26.81);Muscle weakness (generalized) (M62.81) ?  ?Activity Tolerance Patient tolerated treatment well ?  ?Patient Left with call bell/phone within reach;in chair;with chair alarm set ?  ?Nurse Communication Mobility status ?  ? ?   ? ?Time: 0949-1000 ?OT Time Calculation (min): 11 min ? ?Charges: OT General Charges ?$OT Visit: 1 Visit ?OT Treatments ?$Self Care/Home Management : 8-22 mins ? ?Barry Brunner, OT ?Acute Rehabilitation Services ?Pager [phone] ?Office [phone] ? ? ?Chancy Milroy ?08/06/2021, 10:15 AM ?

## 2021-08-06 NOTE — Discharge Summary (Signed)
?Physician Discharge Summary ?  ?Patient: Tyrone Little MRN: [id number] DOB: 1960/12/19  ?Admit date:     08/01/2021  ?Discharge date: 08/06/21  ?Discharge Physician: Alberteen Sam  ? ?PCP: None listed in system ? ? ? ?Recommendations at discharge:  ?Follow-up with primary care doctor in 1 week ?Continue Augmentin for 3 more doses ? ? ? ? ?Discharge Diagnoses: ?Principal Problem: ?  Unresponsive episode, likely acute toxic encephalopathy ?Active Problems: ?  Schizoaffective disorder (HCC) ?  Acute respiratory failure with hypoxia (HCC) ?  Acute metabolic encephalopathy ?  Hypokalemia ?  Aspiration pneumonia (HCC) ?  ? ? ?Hospital Course: ?Tyrone Little is a 61 y.o. M with schizophrenia, developmental delay, resident of a group home, brought in for evaluation of sudden onset of decreased level of consciousness after he started having shaking/seizure-like activity followed by drooling from his mouth    ?  ?On initial evaluation, exam was nonfocal but EDP noted on return evaluation that he had some slurred speech and left-sided drift for which a code stroke was activated.  Neurology consulted.  Subsequent CT head normal.   ? ? ? ? ? ?* Unresponsive episode, likely acute toxic encephalopathy ?Patient intubated in the ER and admitted to ICU.  Neurology consulted. ? ?Based on collateral information gathered, it was suspected most likely that the patient was inadvertently given an incorrect medication, but the true etiology is unclear. ? ?EEG normal, status epilepticus ruled out.  Neurology felt AED not warranted for this first time seizure, even if that were the initial inciting event, but would reconsider this if the patient had another seizure in the future.   ? ? ? ?  ?Possible aspiration pneumonia ?Continue Augmentin for 3 more doses through Tuesday night ?  ?  ?  ?  ? ? ?  ? ? ? ? ? ?The [facility] Controlled Substances Registry was reviewed for this patient prior to discharge.  ? ?Consultants: Critical Care,  Neurology ?Procedures performed: CT head, EEG  ?Disposition: Group home ?Diet recommendation:  ?Discharge Diet Orders (From admission, onward)  ? ?  Start     Ordered  ? 08/06/21 0000  Diet - low sodium heart healthy       ? 08/06/21 6546  ? ?  ?  ? ?  ? ? ? ?DISCHARGE MEDICATION: ?Allergies as of 08/06/2021   ?No Known Allergies ?  ? ?  ?Medication List  ?  ? ?TAKE these medications   ? ?acetaminophen 500 MG tablet ?Commonly known as: TYLENOL ?Take 1,000 mg by mouth every 8 (eight) hours as needed for fever (pain). ?  ?amoxicillin-clavulanate 875-125 MG tablet ?Commonly known as: AUGMENTIN ?Take 1 tablet by mouth every 12 (twelve) hours. ?  ?cetirizine 10 MG tablet ?Commonly known as: ZYRTEC ?Take 10 mg by mouth daily. ?  ?fluPHENAZine 5 MG tablet ?Commonly known as: PROLIXIN ?Take 5 mg by mouth at bedtime. ?  ?hydrOXYzine 50 MG tablet ?Commonly known as: ATARAX ?Take 50-100 mg by mouth See admin instructions. 50mg  daily (as needed for anxiety/agitation), 100 mg at bedtime as needed (no indication given) ?  ?ibuprofen 600 MG tablet ?Commonly known as: ADVIL ?Take 600 mg by mouth every 6 (six) hours as needed (pain). ?  ?linaclotide 145 MCG Caps capsule ?Commonly known as: LINZESS ?Take 145 mcg by mouth daily before breakfast. ?  ?lubiprostone 24 MCG capsule ?Commonly known as: AMITIZA ?Take 24 mcg by mouth 2 (two) times daily with a meal. ?  ?Omega-3 1000 MG  Caps ?Take 1,000 mg by mouth daily. ?  ?OVER THE COUNTER MEDICATION ?Take 1 Scoop by mouth daily. Best Fiber Powder ?  ?pioglitazone 15 MG tablet ?Commonly known as: ACTOS ?Take 15 mg by mouth daily. ?  ?polyethylene glycol powder 17 GM/SCOOP powder ?Commonly known as: GLYCOLAX/MIRALAX ?Take 17 g by mouth daily. ?  ?QUEtiapine 200 MG tablet ?Commonly known as: SEROQUEL ?Take 200 mg by mouth at bedtime. ?  ?risperiDONE 2 MG tablet ?Commonly known as: RISPERDAL ?Take 2 mg by mouth 2 (two) times daily. ?  ?simvastatin 40 MG tablet ?Commonly known as: ZOCOR ?Take 40  mg by mouth at bedtime. ?  ?tamsulosin 0.4 MG Caps capsule ?Commonly known as: FLOMAX ?Take 0.4 mg by mouth daily. ?  ?THEREMS PO ?Take 1 tablet by mouth daily. ?  ?Vitamin D3 125 MCG (5000 UT) Caps ?TAKE 1 CAPSULE BY MOUTH ONCE DAILY  *TAKE WITH FOOD* ?What changed: See the new instructions. ?  ? ?  ? ?  ?  ? ? ?  ?Durable Medical Equipment  ?(From admission, onward)  ?  ? ? ?  ? ?  Start     Ordered  ? 08/05/21 1115  For home use only DME Walker rolling  Once       ?Question Answer Comment  ?Walker: With 5 Inch Wheels   ?Patient needs a walker to treat with the following condition Weakness   ?  ? 08/05/21 1114  ? ?  ?  ? ?  ? ? Follow-up Information   ? ? [place], [facility] Follow up.   ?Why: for home health services. they will call in 1-2 days to set up a time to come out to the house ?Contact information: ?[address] ?[place] [address] ?[phone] ? ? ?  ?  ? ?  ?  ? ?  ? ? ?Discharge Instructions   ? ? Diet - low sodium heart healthy   Complete by: As directed ?  ? Increase activity slowly   Complete by: As directed ?  ? No wound care   Complete by: As directed ?  ? ?  ? ? ?Discharge Exam: ?Filed Weights  ? 08/01/21 2111  ?Weight: 104.1 kg  ? ? ?General: Pt is alert, awake, not in acute distress, comfortable, lying in bed ?Cardiovascular: RRR, nl S1-S2, no murmurs appreciated.   No LE edema.   ?Respiratory: Normal respiratory rate and rhythm.  CTAB without rales or wheezes. ?Abdominal: Abdomen soft and non-tender.  No distension or HSM.   ?Neuro/Psych: Strength symmetric in upper and lower extremities.  Judgment and insight appear at baseline, no halucinations. ? ? ?Condition at discharge: good ? ?The results of significant diagnostics from this hospitalization (including imaging, microbiology, ancillary and laboratory) are listed below for reference.  ? ?Imaging Studies: ?DG Chest 2 View ? ?Result Date: 08/04/2021 ?CLINICAL DATA:  Respiratory failure.  Seizures. EXAM: CHEST -  2 VIEW COMPARISON:  08/02/2021 FINDINGS: Endotracheal tube and nasogastric tube have been removed since prior study. Low lung volumes are again seen with bibasilar atelectasis. No evidence of pulmonary consolidation or pleural effusion. IMPRESSION: No significant change in low lung volumes and bibasilar atelectasis. Electronically Signed   By: Danae Orleans M.D.   On: 08/04/2021 12:10  ? ?DG Abdomen 1 View ? ?Result Date: 08/01/2021 ?CLINICAL DATA:  OG tube placement. EXAM: ABDOMEN - 1 VIEW COMPARISON:  None Available. FINDINGS: An enteric tube is looped in the stomach with tip  overlying the distal aspect of the stomach. Excreted contrast material is noted in the renal collecting systems. No dilated loops of bowel are seen in the included portion of the abdomen to suggest obstruction. Multiple monitoring leads overlie the patient. No acute osseous abnormality is seen. IMPRESSION: Enteric tube in the stomach. Electronically Signed   By: Sebastian AcheAllen  Grady M.D.   On: 08/01/2021 12:09  ? ?DG Chest Port 1 View ? ?Result Date: 08/02/2021 ?CLINICAL DATA:  Respiratory failure. EXAM: PORTABLE CHEST 1 VIEW COMPARISON:  Aug 01, 2021. FINDINGS: Stable cardiomediastinal silhouette. Endotracheal and nasogastric tubes are in grossly good position. Hypoinflation of the lungs are noted with mild bibasilar subsegmental atelectasis. Bony thorax is unremarkable. IMPRESSION: Hypoinflation of the lungs with mild bibasilar subsegmental atelectasis. Electronically Signed   By: Lupita RaiderJames  Green Jr M.D.   On: 08/02/2021 07:58  ? ?DG Chest Portable 1 View ? ?Result Date: 08/01/2021 ?CLINICAL DATA:  Intubation. EXAM: PORTABLE CHEST 1 VIEW COMPARISON:  Chest radiographs 08/01/2021 at 10:50 a.m. FINDINGS: An endotracheal tube has been placed and terminates 4 cm above the carina. The cardiomediastinal silhouette is unchanged. Lung volumes remain low with interval increased opacity in the left greater than right lung bases. No sizable pleural effusion or  pneumothorax is identified. IMPRESSION: 1. Endotracheal tube as above. 2. Low lung volumes with increased bibasilar opacities, likely atelectasis. Electronically Signed   By: Sebastian AcheAllen  Grady M.D.   On: 08/01/2021 12:08

## 2021-08-06 NOTE — Discharge Instructions (Signed)
Continue Augmentin twice daily until Tuesday night for 3 more doses ? ?Follow up with primary care doctor in 1 week ?

## 2021-08-06 NOTE — TOC Transition Note (Signed)
Transition of Care (TOC) - CM/SW Discharge Note ? ? ?Patient Details  ?Name: Tyrone Little ?MRN: [id number] ?Date of Birth: June 04, 1960 ? ?Transition of Care (TOC) CM/SW Contact:  ?Kermit Balo, RN ?Phone Number: ?08/06/2021, 9:42 AM ? ? ?Clinical Narrative:    ?Patient is discharging back to [facility] today. CM talked to [facility] at the facility and they will pick him up around 12 pm. Bedside RN updated. CM has also called the patients brother and updated him.  ?Dan Humphreys is at the bedside and HH arranged through [facility].  ? ? ?Final next level of care: Group Home ?Barriers to Discharge: No Barriers Identified ? ? ?Patient Goals and CMS Choice ?  ?CMS Medicare.gov Compare Post Acute Care list provided to:: Patient Represenative (must comment) ?  ? ?Discharge Placement ?  ?           ?  ?  ?  ?  ? ?Discharge Plan and Services ?In-house Referral: Clinical Social Work ?Discharge Planning Services: CM Consult ?           ?DME Arranged: Walker rolling ?DME Agency: [employer] ?Date DME Agency Contacted: 08/05/21 ?Time DME Agency Contacted: 1329 ?Representative spoke with at DME Agency: Leavy Cella ?HH Arranged: PT, OT ?HH [facility]: [facility] ?Date HH Agency Contacted: 08/06/21 ?Time HH Agency Contacted: 1329 ?Representative spoke with at [facility] Agency: Pearson Grippe aware of d/c home ? ?Social Determinants of Health (SDOH) Interventions ?  ? ? ?Readmission Risk Interventions ?   ? View : No data to display.  ?  ?  ?  ? ? ? ? ? ?

## 2021-08-07 ENCOUNTER — Other Ambulatory Visit: Payer: Self-pay

## 2021-08-07 ENCOUNTER — Encounter: Payer: Self-pay | Admitting: Nurse Practitioner

## 2021-08-07 ENCOUNTER — Ambulatory Visit (INDEPENDENT_AMBULATORY_CARE_PROVIDER_SITE_OTHER): Payer: Medicare Other | Admitting: Nurse Practitioner

## 2021-08-07 VITALS — BP 140/86 | HR 90 | Temp 98.3°F | Resp 16 | Ht 71.25 in | Wt 235.8 lb

## 2021-08-07 DIAGNOSIS — R404 Transient alteration of awareness: Secondary | ICD-10-CM

## 2021-08-07 DIAGNOSIS — J9601 Acute respiratory failure with hypoxia: Secondary | ICD-10-CM | POA: Diagnosis not present

## 2021-08-07 DIAGNOSIS — E876 Hypokalemia: Secondary | ICD-10-CM | POA: Diagnosis not present

## 2021-08-07 DIAGNOSIS — R4189 Other symptoms and signs involving cognitive functions and awareness: Secondary | ICD-10-CM

## 2021-08-07 DIAGNOSIS — Z09 Encounter for follow-up examination after completed treatment for conditions other than malignant neoplasm: Secondary | ICD-10-CM

## 2021-08-07 DIAGNOSIS — G9341 Metabolic encephalopathy: Secondary | ICD-10-CM

## 2021-08-07 NOTE — Progress Notes (Signed)
? ?BP 140/86   Pulse 90   Temp 98.3 ?F (36.8 ?C) (Oral)   Resp 16   Ht 5' 11.25" (1.81 m)   Wt 235 lb 12.8 oz (107 kg)   SpO2 97%   BMI 32.66 kg/m?   ? ?Subjective:  ? ? Patient ID: Tyrone Little, male    DOB: 04/01/1961, 61 y.o.   MRN: [id number] ? ?HPI: ?Tyrone Little is a 61 y.o. male ? ?Chief Complaint  ?Patient presents with  ? Follow-up  ?  ER  ? ?Hospital follow-up/unresponsive episode/acute toxic encephalopathy/hypokalemia/acute respiratory failure with hypoxia: Patient was seen in the emergency department on 08/01/2021.  He had an episode of seizure-like activity, slurred speech, aphasia and drooling.  EMS picked him up from his group home and brought him to the hospital where patient experienced left-sided facial droop drooling and slurred speech.  Stroke alert was called at that time.  CT head showed no acute intracranial pathology.  CT angio head and neck showed no hemodynamically significant stenosis or occlusion in the head or neck.  Mild calcified plaque at the carotid bifurcations resulting in less than 50% stenosis.  No infarct noted.  Hest x-ray showed low lung volumes with mild streaky bibasilar opacities, favor for atelectasis over infection.  Patient was intubated and admitted to ICU.  Given Keppra EEG was performed and it was negative for seizure activity.  EEG suggested moderate to severe diffuse encephalopathy, nonspecific etiology but likely related to sedation.  No seizures or epileptiform discharges were seen throughout the recording.  Patient was extubated without difficulty.  Patient was discharged on 08/06/2021.  He was to continue taking Augmentin for 3 more doses.  Patient has been taking his medication.  Resents today awake alert and oriented.  Neuro exam is within normal limits.  Patient reports he is feeling good and has had no other episodes.  We will get metabolic panel due to hypokalemia while in the hospital.  Patient released to do his regular routine. ? ?Relevant past medical,  surgical, family and social history reviewed and updated as indicated. Interim medical history since our last visit reviewed. ?Allergies and medications reviewed and updated. ? ?Review of Systems ? ?Constitutional: Negative for fever or weight change.  ?Respiratory: Negative for cough and shortness of breath.   ?Cardiovascular: Negative for chest pain or palpitations.  ?Gastrointestinal: Negative for abdominal pain, no bowel changes.  ?Musculoskeletal: Negative for gait problem or joint swelling.  ?Skin: Negative for rash.  ?Neurological: Negative for dizziness or headache.  ?No other specific complaints in a complete review of systems (except as listed in HPI above).  ? ?   ?Objective:  ?  ?BP 140/86   Pulse 90   Temp 98.3 ?F (36.8 ?C) (Oral)   Resp 16   Ht 5' 11.25" (1.81 m)   Wt 235 lb 12.8 oz (107 kg)   SpO2 97%   BMI 32.66 kg/m?   ?Wt Readings from Last 3 Encounters:  ?08/07/21 235 lb 12.8 oz (107 kg)  ?08/01/21 229 lb 8 oz (104.1 kg)  ?05/09/21 237 lb 14.4 oz (107.9 kg)  ?  ?Physical Exam ? ?Constitutional: Patient appears well-developed and well-nourished. Obese  No distress.  ?HEENT: head atraumatic, normocephalic, pupils equal and reactive to light,  neck supple ?Cardiovascular: Normal rate, regular rhythm and normal heart sounds.  No murmur heard. No BLE edema. ?Pulmonary/Chest: Effort normal and breath sounds normal. No respiratory distress. ?Abdominal: Soft.  There is no tenderness. ?Psychiatric: Patient has a normal  mood and affect. behavior is normal. Judgment and thought content normal.  ?Results for orders placed or performed during the hospital encounter of 08/01/21  ?MRSA Next Gen by PCR, Nasal  ? Specimen: Nasal Mucosa; Nasal Swab  ?Result Value Ref Range  ? MRSA by PCR Next Gen NOT DETECTED NOT DETECTED  ?CBC  ?Result Value Ref Range  ? WBC 10.5 4.0 - 10.5 K/uL  ? RBC 5.15 4.22 - 5.81 MIL/uL  ? Hemoglobin 14.8 13.0 - 17.0 g/dL  ? HCT 45.2 39.0 - 52.0 %  ? MCV 87.8 80.0 - 100.0 fL  ? MCH  28.7 26.0 - 34.0 pg  ? MCHC 32.7 30.0 - 36.0 g/dL  ? RDW 12.6 11.5 - 15.5 %  ? Platelets 220 150 - 400 K/uL  ? nRBC 0.0 0.0 - 0.2 %  ?Basic metabolic panel  ?Result Value Ref Range  ? Sodium 139 135 - 145 mmol/L  ? Potassium 3.9 3.5 - 5.1 mmol/L  ? Chloride 105 98 - 111 mmol/L  ? CO2 23 22 - 32 mmol/L  ? Glucose, Bld 122 (H) 70 - 99 mg/dL  ? BUN 18 6 - 20 mg/dL  ? Creatinine, Ser 1.01 0.61 - 1.24 mg/dL  ? Calcium 9.1 8.9 - 10.3 mg/dL  ? GFR, Estimated >60 >60 mL/min  ? Anion gap 11 5 - 15  ?Magnesium  ?Result Value Ref Range  ? Magnesium 1.9 1.7 - 2.4 mg/dL  ?HIV Antibody (routine testing w rflx)  ?Result Value Ref Range  ? HIV Screen 4th Generation wRfx Non Reactive Non Reactive  ?Hemoglobin A1c  ?Result Value Ref Range  ? Hgb A1c MFr Bld 5.4 4.8 - 5.6 %  ? Mean Plasma Glucose 108.28 mg/dL  ?Glucose, capillary  ?Result Value Ref Range  ? Glucose-Capillary 149 (H) 70 - 99 mg/dL  ? Comment 1 Notify RN   ? Comment 2 Document in Chart   ?Glucose, capillary  ?Result Value Ref Range  ? Glucose-Capillary 147 (H) 70 - 99 mg/dL  ? Comment 1 Notify RN   ? Comment 2 Document in Chart   ?Glucose, capillary  ?Result Value Ref Range  ? Glucose-Capillary 111 (H) 70 - 99 mg/dL  ? Comment 1 Notify RN   ? Comment 2 Document in Chart   ?Glucose, capillary  ?Result Value Ref Range  ? Glucose-Capillary 141 (H) 70 - 99 mg/dL  ?Glucose, capillary  ?Result Value Ref Range  ? Glucose-Capillary 90 70 - 99 mg/dL  ?Glucose, capillary  ?Result Value Ref Range  ? Glucose-Capillary 81 70 - 99 mg/dL  ?CBC with Differential/Platelet  ?Result Value Ref Range  ? WBC 9.9 4.0 - 10.5 K/uL  ? RBC 4.49 4.22 - 5.81 MIL/uL  ? Hemoglobin 13.2 13.0 - 17.0 g/dL  ? HCT 38.2 (L) 39.0 - 52.0 %  ? MCV 85.1 80.0 - 100.0 fL  ? MCH 29.4 26.0 - 34.0 pg  ? MCHC 34.6 30.0 - 36.0 g/dL  ? RDW 12.9 11.5 - 15.5 %  ? Platelets 212 150 - 400 K/uL  ? nRBC 0.0 0.0 - 0.2 %  ? Neutrophils Relative % 81 %  ? Neutro Abs 8.0 (H) 1.7 - 7.7 K/uL  ? Lymphocytes Relative 11 %  ? Lymphs  Abs 1.1 0.7 - 4.0 K/uL  ? Monocytes Relative 8 %  ? Monocytes Absolute 0.8 0.1 - 1.0 K/uL  ? Eosinophils Relative 0 %  ? Eosinophils Absolute 0.0 0.0 - 0.5 K/uL  ? Basophils Relative 0 %  ?  Basophils Absolute 0.0 0.0 - 0.1 K/uL  ? Immature Granulocytes 0 %  ? Abs Immature Granulocytes 0.04 0.00 - 0.07 K/uL  ?Comprehensive metabolic panel  ?Result Value Ref Range  ? Sodium 139 135 - 145 mmol/L  ? Potassium 3.8 3.5 - 5.1 mmol/L  ? Chloride 107 98 - 111 mmol/L  ? CO2 25 22 - 32 mmol/L  ? Glucose, Bld 89 70 - 99 mg/dL  ? BUN 18 6 - 20 mg/dL  ? Creatinine, Ser 1.12 0.61 - 1.24 mg/dL  ? Calcium 8.5 (L) 8.9 - 10.3 mg/dL  ? Total Protein 5.7 (L) 6.5 - 8.1 g/dL  ? Albumin 3.1 (L) 3.5 - 5.0 g/dL  ? AST 12 (L) 15 - 41 U/L  ? ALT 17 0 - 44 U/L  ? Alkaline Phosphatase 60 38 - 126 U/L  ? Total Bilirubin 1.2 0.3 - 1.2 mg/dL  ? GFR, Estimated >60 >60 mL/min  ? Anion gap 7 5 - 15  ?Glucose, capillary  ?Result Value Ref Range  ? Glucose-Capillary 96 70 - 99 mg/dL  ?Glucose, capillary  ?Result Value Ref Range  ? Glucose-Capillary 92 70 - 99 mg/dL  ?Glucose, capillary  ?Result Value Ref Range  ? Glucose-Capillary 82 70 - 99 mg/dL  ?Glucose, capillary  ?Result Value Ref Range  ? Glucose-Capillary 91 70 - 99 mg/dL  ?Glucose, capillary  ?Result Value Ref Range  ? Glucose-Capillary 103 (H) 70 - 99 mg/dL  ?CBC  ?Result Value Ref Range  ? WBC 6.7 4.0 - 10.5 K/uL  ? RBC 4.54 4.22 - 5.81 MIL/uL  ? Hemoglobin 13.0 13.0 - 17.0 g/dL  ? HCT 39.3 39.0 - 52.0 %  ? MCV 86.6 80.0 - 100.0 fL  ? MCH 28.6 26.0 - 34.0 pg  ? MCHC 33.1 30.0 - 36.0 g/dL  ? RDW 12.3 11.5 - 15.5 %  ? Platelets 189 150 - 400 K/uL  ? nRBC 0.0 0.0 - 0.2 %  ?Comprehensive metabolic panel  ?Result Value Ref Range  ? Sodium 142 135 - 145 mmol/L  ? Potassium 3.4 (L) 3.5 - 5.1 mmol/L  ? Chloride 109 98 - 111 mmol/L  ? CO2 28 22 - 32 mmol/L  ? Glucose, Bld 106 (H) 70 - 99 mg/dL  ? BUN 13 6 - 20 mg/dL  ? Creatinine, Ser 0.93 0.61 - 1.24 mg/dL  ? Calcium 8.6 (L) 8.9 - 10.3 mg/dL  ? Total  Protein 6.0 (L) 6.5 - 8.1 g/dL  ? Albumin 3.0 (L) 3.5 - 5.0 g/dL  ? AST 14 (L) 15 - 41 U/L  ? ALT 16 0 - 44 U/L  ? Alkaline Phosphatase 60 38 - 126 U/L  ? Total Bilirubin 0.7 0.3 - 1.2 mg/dL  ? GFR, Estimated >60 >60

## 2021-08-07 NOTE — Assessment & Plan Note (Signed)
Last potassium was 3.4.  We will get a  metabolic panel to check current potassium. ?

## 2021-08-07 NOTE — Assessment & Plan Note (Signed)
She reports he is breathing fine.  He denies any shortness of breath. ?

## 2021-08-07 NOTE — Assessment & Plan Note (Signed)
Patient has had no other unresponsive episodes.  Patient reports he is doing well.  Patient released to continue his daily activities. ?

## 2021-08-07 NOTE — Assessment & Plan Note (Signed)
Patient is back to his norm.  Has had no other unresponsive episodes. ?

## 2021-08-08 LAB — COMPLETE METABOLIC PANEL WITH GFR
AG Ratio: 1.5 (calc) (ref 1.0–2.5)
ALT: 17 U/L (ref 9–46)
AST: 18 U/L (ref 10–35)
Albumin: 4 g/dL (ref 3.6–5.1)
Alkaline phosphatase (APISO): 63 U/L (ref 35–144)
BUN: 18 mg/dL (ref 7–25)
CO2: 28 mmol/L (ref 20–32)
Calcium: 9.6 mg/dL (ref 8.6–10.3)
Chloride: 105 mmol/L (ref 98–110)
Creat: 0.9 mg/dL (ref 0.70–1.30)
Globulin: 2.7 g/dL (calc) (ref 1.9–3.7)
Glucose, Bld: 104 mg/dL — ABNORMAL HIGH (ref 65–99)
Potassium: 4.3 mmol/L (ref 3.5–5.3)
Sodium: 141 mmol/L (ref 135–146)
Total Bilirubin: 0.4 mg/dL (ref 0.2–1.2)
Total Protein: 6.7 g/dL (ref 6.1–8.1)
eGFR: 98 mL/min/{1.73_m2} (ref >=60)

## 2021-08-09 ENCOUNTER — Other Ambulatory Visit: Payer: Self-pay | Admitting: Nurse Practitioner

## 2021-08-09 NOTE — Telephone Encounter (Signed)
Medication Refill - Medication: All meds except pych meds ? ?Has the patient contacted their pharmacy? No. ?House manager christy Bruton called to request the refills.  Se said she did not know which they needed, I should have the list.  She then said all except pysch meds.  ?Her number [phone] ? ?Preferred Pharmacy (with phone number or street name): [facility] - , [place] - [address] ? ?Has the patient been seen for an appointment in the last year OR does the patient have an upcoming appointment? Yes.   ? ?HELPING [facility]S ?Assisted Living, Mental Illness ?[place], [place] ?

## 2021-08-10 NOTE — Telephone Encounter (Signed)
Requested medication (s) are due for refill today: yes ? ?Requested medication (s) are on the active medication list: yes ? ?Last refill:  historical medications ? ?Future visit scheduled: yes ? ?Notes to clinic:  Unable to refill per protocol, last refill by historical provider for all prescriptions. Needs PCP review for dosage amounts and refills. ? ? ?  ?Requested Prescriptions  ?Pending Prescriptions Disp Refills  ? cetirizine (ZYRTEC) 10 MG tablet    ?  Sig: Take 1 tablet (10 mg total) by mouth daily.  ?  ? Ear, Nose, and Throat:  Antihistamines 2 Passed - 08/09/2021  4:11 PM  ?  ?  Passed - Cr in normal range and within 360 days  ?  Creat  ?Date Value Ref Range Status  ?08/07/2021 0.90 0.70 - 1.30 mg/dL Final  ?   ?  ?  Passed - Valid encounter within last 12 months  ?  Recent Outpatient Visits   ? ?      ? 3 days ago Hospital discharge follow-up  ? [place], FNP  ? 3 months ago Schizoaffective disorder, unspecified type [facility])  ? [facility] Bo Merino, FNP  ? ?  ?  ?Future Appointments   ? ?        ? In 1 month Reece Packer, Myna Hidalgo, [facility], [facility]  ? ?  ? ? ?  ?  ?  ? ibuprofen (ADVIL) 600 MG tablet 30 tablet   ?  Sig: Take 1 tablet (600 mg total) by mouth every 6 (six) hours as needed (pain).  ?  ? Analgesics:  NSAIDS Failed - 08/09/2021  4:11 PM  ?  ?  Failed - Manual Review: Labs are only required if the patient has taken medication for more than 8 weeks.  ?  ?  Passed - Cr in normal range and within 360 days  ?  Creat  ?Date Value Ref Range Status  ?08/07/2021 0.90 0.70 - 1.30 mg/dL Final  ?   ?  ?  Passed - HGB in normal range and within 360 days  ?  Hemoglobin  ?Date Value Ref Range Status  ?08/04/2021 13.0 13.0 - 17.0 g/dL Final  ?   ?  ?  Passed - PLT in normal range and within 360 days  ?  Platelets  ?Date Value Ref Range Status  ?08/04/2021 189 150 - 400 K/uL Final  ?   ?  ?  Passed - HCT in normal range and within  360 days  ?  HCT  ?Date Value Ref Range Status  ?08/04/2021 39.3 39.0 - 52.0 % Final  ?   ?  ?  Passed - eGFR is 30 or above and within 360 days  ?  GFR, Estimated  ?Date Value Ref Range Status  ?08/04/2021 >60 >60 mL/min Final  ?  Comment:  ?  (NOTE) ?Calculated using the CKD-EPI Creatinine Equation (2021) ?  ? ?eGFR  ?Date Value Ref Range Status  ?08/07/2021 98 > OR = 60 mL/min/1.50m Final  ?  Comment:  ?  The eGFR is based on the CKD-EPI 2021 equation. To calculate  ?the new eGFR from a previous Creatinine or Cystatin C ?result, go to https://www.kidney.org/professionals/ ?kdoqi/gfr%5Fcalculator ?  ?   ?  ?  Passed - Patient is not pregnant  ?  ?  Passed - Valid encounter within last 12 months  ?  Recent Outpatient Visits   ? ?      ?  3 days ago Hospital discharge follow-up  ? [place], FNP  ? 3 months ago Schizoaffective disorder, unspecified type [facility])  ? [facility] Bo Merino, FNP  ? ?  ?  ?Future Appointments   ? ?        ? In 1 month Reece Packer, Myna Hidalgo, [facility], [facility]  ? ?  ? ? ?  ?  ?  ? linaclotide (LINZESS) 145 MCG CAPS capsule 30 capsule   ?  Sig: Take 1 capsule (145 mcg total) by mouth daily before breakfast.  ?  ? Gastroenterology: Irritable Bowel Syndrome Passed - 08/09/2021  4:11 PM  ?  ?  Passed - Valid encounter within last 12 months  ?  Recent Outpatient Visits   ? ?      ? 3 days ago Hospital discharge follow-up  ? [place], FNP  ? 3 months ago Schizoaffective disorder, unspecified type [facility])  ? [facility] Bo Merino, FNP  ? ?  ?  ?Future Appointments   ? ?        ? In 1 month Reece Packer, Myna Hidalgo, [facility], [facility]  ? ?  ? ? ?  ?  ?  ? lubiprostone (AMITIZA) 24 MCG capsule    ?  Sig: Take 1 capsule (24 mcg total) by mouth 2 (two) times daily with a meal.  ?  ? Gastroenterology: Irritable Bowel Syndrome - lubiprostone Passed -  08/09/2021  4:11 PM  ?  ?  Passed - AST in normal range and within 360 days  ?  AST  ?Date Value Ref Range Status  ?08/07/2021 18 10 - 35 U/L Final  ?   ?  ?  Passed - ALT in normal range and within 360 days  ?  ALT  ?Date Value Ref Range Status  ?08/07/2021 17 9 - 46 U/L Final  ?   ?  ?  Passed - Valid encounter within last 12 months  ?  Recent Outpatient Visits   ? ?      ? 3 days ago Hospital discharge follow-up  ? [place], FNP  ? 3 months ago Schizoaffective disorder, unspecified type [facility])  ? [facility] Bo Merino, FNP  ? ?  ?  ?Future Appointments   ? ?        ? In 1 month Reece Packer, Myna Hidalgo, [facility], [place]  ? ?  ? ? ?  ?  ?  ? Multiple Vitamin (THEREMS) TABS    ?  Sig: Take by mouth daily.  ?  ? There is no refill protocol information for this order  ?  ? Omega-3 1000 MG CAPS    ?  Sig: Take 1 capsule (1,000 mg total) by mouth daily.  ?  ? Off-Protocol Failed - 08/09/2021  4:11 PM  ?  ?  Failed - Medication not assigned to a protocol, review manually.  ?  ?  Passed - Valid encounter within last 12 months  ?  Recent Outpatient Visits   ? ?      ? 3 days ago Hospital discharge follow-up  ? [place], FNP  ? 3 months ago Schizoaffective disorder, unspecified type [facility])  ? [facility] Serafina Royals F, FNP  ? ?  ?  ?  Future Appointments   ? ?        ? In 1 month Reece Packer, Myna Hidalgo, [facility], [place]  ? ?  ? ? ?  ?  ? Endocrinology:  Nutritional Agents - omega-3 acid ethyl esters Failed - 08/09/2021  4:11 PM  ?  ?  Failed - Lipid Panel in normal range within the last 12 months  ?  Cholesterol  ?Date Value Ref Range Status  ?05/09/2021 125 <200 mg/dL Final  ? ?LDL Cholesterol (Calc)  ?Date Value Ref Range Status  ?05/09/2021 70 mg/dL (calc) Final  ?  Comment:  ?  Reference range: <100 ?Marland Kitchen ?Desirable range <100 mg/dL for primary prevention;   ?<70 mg/dL for  patients with CHD or diabetic patients  ?with > or = 2 CHD risk factors. ?. ?LDL-C is now calculated using the Martin-Hopkins  ?calculation, which is a validated novel method providing  ?better accuracy than the Friedewald equation in the  ?estimation of LDL-C.  ?Cresenciano Genre et al. Annamaria Helling. 3790;240(97): 2061-2068  ?(http://education.[employer].com/faq/FAQ164) ?  ? ?HDL  ?Date Value Ref Range Status  ?05/09/2021 38 (L) > OR = 40 mg/dL Final  ? ?Triglycerides  ?Date Value Ref Range Status  ?05/09/2021 88 <150 mg/dL Final  ? ?  ?  ?  Passed - Valid encounter within last 12 months  ?  Recent Outpatient Visits   ? ?      ? 3 days ago Hospital discharge follow-up  ? [place], FNP  ? 3 months ago Schizoaffective disorder, unspecified type [facility])  ? [facility] Bo Merino, FNP  ? ?  ?  ?Future Appointments   ? ?        ? In 1 month Reece Packer, Myna Hidalgo, [facility], [place]  ? ?  ? ? ?  ?  ? Over the Counter:  OTC Passed - 08/09/2021  4:11 PM  ?  ?  Passed - Valid encounter within last 12 months  ?  Recent Outpatient Visits   ? ?      ? 3 days ago Hospital discharge follow-up  ? [place], FNP  ? 3 months ago Schizoaffective disorder, unspecified type [facility])  ? [facility] Bo Merino, FNP  ? ?  ?  ?Future Appointments   ? ?        ? In 1 month Reece Packer, Myna Hidalgo, [facility], [facility]  ? ?  ? ? ?  ?  ?  ? pioglitazone (ACTOS) 15 MG tablet    ?  Sig: Take 1 tablet (15 mg total) by mouth daily.  ?  ? Endocrinology:  Diabetes - Glitazones - pioglitazone Passed - 08/09/2021  4:11 PM  ?  ?  Passed - HBA1C is between 0 and 7.9 and within 180 days  ?  Hgb A1c MFr Bld  ?Date Value Ref Range Status  ?08/02/2021 5.4 4.8 - 5.6 % Final  ?  Comment:  ?  (NOTE) ?Pre diabetes:          5.7%-6.4% ? ?Diabetes:              >6.4% ? ?Glycemic control for   <7.0% ?adults with diabetes ?  ?    ?  ?  Passed - Valid encounter within last 6 months  ?  Recent Outpatient Visits   ? ?      ?  3 days ago Hospital discharge follow-up  ? [place], FNP  ? 3 months

## 2021-08-13 MED ORDER — TAMSULOSIN HCL 0.4 MG PO CAPS: 0.4000 mg | ORAL_CAPSULE | Freq: Every day | ORAL | 1 refills | Status: DC

## 2021-08-13 MED ORDER — OMEGA-3 1000 MG PO CAPS: 1000.0000 mg | ORAL_CAPSULE | Freq: Every day | ORAL | 1 refills | Status: DC

## 2021-08-13 MED ORDER — ACETAMINOPHEN 500 MG PO TABS: 1000.0000 mg | ORAL_TABLET | Freq: Three times a day (TID) | ORAL | 1 refills | Status: AC | PRN

## 2021-08-13 MED ORDER — PIOGLITAZONE HCL 15 MG PO TABS: 15.0000 mg | ORAL_TABLET | Freq: Every day | ORAL | 1 refills | Status: DC

## 2021-08-13 MED ORDER — LINACLOTIDE 145 MCG PO CAPS: 145.0000 ug | ORAL_CAPSULE | Freq: Every day | ORAL | 1 refills | Status: DC

## 2021-08-13 MED ORDER — THEREMS PO TABS: 1.0000 | ORAL_TABLET | Freq: Every day | ORAL | 1 refills | Status: DC

## 2021-08-13 MED ORDER — IBUPROFEN 600 MG PO TABS: 600.0000 mg | ORAL_TABLET | Freq: Four times a day (QID) | ORAL | 1 refills | Status: AC | PRN

## 2021-08-13 MED ORDER — POLYETHYLENE GLYCOL 3350 17 GM/SCOOP PO POWD: 17.0000 g | Freq: Every day | ORAL | 1 refills | Status: DC

## 2021-08-13 MED ORDER — SIMVASTATIN 40 MG PO TABS: 40.0000 mg | ORAL_TABLET | Freq: Every day | ORAL | 1 refills | Status: DC

## 2021-08-13 MED ORDER — CETIRIZINE HCL 10 MG PO TABS: 10.0000 mg | ORAL_TABLET | Freq: Every day | ORAL | 1 refills | Status: DC

## 2021-08-13 MED ORDER — LUBIPROSTONE 24 MCG PO CAPS: 24.0000 ug | ORAL_CAPSULE | Freq: Two times a day (BID) | ORAL | 1 refills | Status: DC

## 2021-08-16 ENCOUNTER — Telehealth: Payer: Self-pay | Admitting: Nurse Practitioner

## 2021-08-16 ENCOUNTER — Other Ambulatory Visit: Payer: Self-pay | Admitting: Nurse Practitioner

## 2021-08-16 NOTE — Telephone Encounter (Signed)
Tyrone Little at care home notified

## 2021-08-16 NOTE — Telephone Encounter (Signed)
Tyrone Little from [facility] called to report that the patient is not constipated and actually uses the bathroom all over himself frequently, please advise she wants to know if he needs to continue taking laxatives that have been prescribed for him. He takes Miralax and also a pill that she does not know the name of  Best contact: [phone]

## 2021-08-16 NOTE — Telephone Encounter (Signed)
Please advise 

## 2021-08-24 NOTE — Telephone Encounter (Unsigned)
Copied from CRM [id number]. Topic: General - Call Back - No Documentation >> Aug 24, 2021  9:56 AM Randol Kern wrote: Reason for CRM: Adelina Mings from [employer] needs to discuss the diagnosis for why the patient needs incontinence supplies.  Best contact: [phone]

## 2021-08-24 NOTE — Telephone Encounter (Signed)
What will diagnosis be

## 2021-08-25 ENCOUNTER — Emergency Department: Payer: Medicare Other

## 2021-08-25 ENCOUNTER — Other Ambulatory Visit: Payer: Self-pay

## 2021-08-25 ENCOUNTER — Observation Stay
Admission: EM | Admit: 2021-08-25 | Discharge: 2021-08-26 | Disposition: A | Discharge status: Home / Self Care | Payer: Medicare Other | Attending: Hospitalist | Admitting: Hospitalist

## 2021-08-25 DIAGNOSIS — Z20822 Contact with and (suspected) exposure to covid-19: Secondary | ICD-10-CM | POA: Insufficient documentation

## 2021-08-25 DIAGNOSIS — R4182 Altered mental status, unspecified: Secondary | ICD-10-CM | POA: Insufficient documentation

## 2021-08-25 DIAGNOSIS — R2681 Unsteadiness on feet: Secondary | ICD-10-CM | POA: Insufficient documentation

## 2021-08-25 DIAGNOSIS — R4781 Slurred speech: Secondary | ICD-10-CM | POA: Diagnosis present

## 2021-08-25 DIAGNOSIS — M6281 Muscle weakness (generalized): Secondary | ICD-10-CM | POA: Diagnosis not present

## 2021-08-25 DIAGNOSIS — R7309 Other abnormal glucose: Secondary | ICD-10-CM | POA: Insufficient documentation

## 2021-08-25 DIAGNOSIS — G9340 Encephalopathy, unspecified: Secondary | ICD-10-CM | POA: Diagnosis present

## 2021-08-25 DIAGNOSIS — R569 Unspecified convulsions: Secondary | ICD-10-CM

## 2021-08-25 DIAGNOSIS — I639 Cerebral infarction, unspecified: Secondary | ICD-10-CM | POA: Diagnosis not present

## 2021-08-25 LAB — COMPREHENSIVE METABOLIC PANEL
ALT: 14 U/L (ref 0–44)
AST: 15 U/L (ref 15–41)
Albumin: 3.8 g/dL (ref 3.5–5.0)
Alkaline Phosphatase: 66 U/L (ref 38–126)
Anion gap: 8 (ref 5–15)
BUN: 16 mg/dL (ref 6–20)
CO2: 27 mmol/L (ref 22–32)
Calcium: 8.8 mg/dL — ABNORMAL LOW (ref 8.9–10.3)
Chloride: 104 mmol/L (ref 98–111)
Creatinine, Ser: 0.78 mg/dL (ref 0.61–1.24)
GFR, Estimated: >60 mL/min (ref >60)
Glucose, Bld: 178 mg/dL — ABNORMAL HIGH (ref 70–99)
Potassium: 3.7 mmol/L (ref 3.5–5.1)
Sodium: 139 mmol/L (ref 135–145)
Total Bilirubin: 0.8 mg/dL (ref 0.3–1.2)
Total Protein: 6.8 g/dL (ref 6.5–8.1)

## 2021-08-25 LAB — BLOOD GAS, VENOUS
Acid-base deficit: 0.3 mmol/L (ref 0.0–2.0)
Bicarbonate: 27.1 mmol/L (ref 20.0–28.0)
O2 Saturation: 46.8 %
Patient temperature: 37
pCO2, Ven: 55 mmHg (ref 44–60)
pH, Ven: 7.3 (ref 7.25–7.43)

## 2021-08-25 LAB — URINE DRUG SCREEN, QUALITATIVE (ARMC ONLY)
Amphetamines, Ur Screen: NOT DETECTED
Barbiturates, Ur Screen: NOT DETECTED
Benzodiazepine, Ur Scrn: NOT DETECTED
Cannabinoid 50 Ng, Ur ~~LOC~~: NOT DETECTED
Cocaine Metabolite,Ur ~~LOC~~: NOT DETECTED
MDMA (Ecstasy)Ur Screen: NOT DETECTED
Methadone Scn, Ur: NOT DETECTED
Opiate, Ur Screen: NOT DETECTED
Phencyclidine (PCP) Ur S: NOT DETECTED
Tricyclic, Ur Screen: POSITIVE — AB

## 2021-08-25 LAB — URINALYSIS, ROUTINE W REFLEX MICROSCOPIC
Bilirubin Urine: NEGATIVE
Glucose, UA: NEGATIVE mg/dL
Hgb urine dipstick: NEGATIVE
Ketones, ur: NEGATIVE mg/dL
Leukocytes,Ua: NEGATIVE
Nitrite: NEGATIVE
Protein, ur: NEGATIVE mg/dL
Specific Gravity, Urine: 1.02 (ref 1.005–1.030)
pH: 7 (ref 5.0–8.0)

## 2021-08-25 LAB — DIFFERENTIAL
Abs Immature Granulocytes: 0.02 10*3/uL (ref 0.00–0.07)
Basophils Absolute: 0 10*3/uL (ref 0.0–0.1)
Basophils Relative: 0 %
Eosinophils Absolute: 0 10*3/uL (ref 0.0–0.5)
Eosinophils Relative: 0 %
Immature Granulocytes: 0 %
Lymphocytes Relative: 13 %
Lymphs Abs: 0.9 10*3/uL (ref 0.7–4.0)
Monocytes Absolute: 0.2 10*3/uL (ref 0.1–1.0)
Monocytes Relative: 3 %
Neutro Abs: 5.3 10*3/uL (ref 1.7–7.7)
Neutrophils Relative %: 84 %

## 2021-08-25 LAB — LACTIC ACID, PLASMA
Lactic Acid, Venous: 0.9 mmol/L (ref 0.5–1.9)
Lactic Acid, Venous: 0.9 mmol/L (ref 0.5–1.9)

## 2021-08-25 LAB — CBC
HCT: 43.9 % (ref 39.0–52.0)
Hemoglobin: 14.3 g/dL (ref 13.0–17.0)
MCH: 28.3 pg (ref 26.0–34.0)
MCHC: 32.6 g/dL (ref 30.0–36.0)
MCV: 86.8 fL (ref 80.0–100.0)
Platelets: 293 10*3/uL (ref 150–400)
RBC: 5.06 MIL/uL (ref 4.22–5.81)
RDW: 12.6 % (ref 11.5–15.5)
WBC: 6.4 10*3/uL (ref 4.0–10.5)
nRBC: 0 % (ref 0.0–0.2)

## 2021-08-25 LAB — PROTIME-INR
INR: 1.1 (ref 0.8–1.2)
Prothrombin Time: 13.7 seconds (ref 11.4–15.2)

## 2021-08-25 LAB — ACETAMINOPHEN LEVEL: Acetaminophen (Tylenol), Serum: <10 ug/mL — ABNORMAL LOW (ref 10–30)

## 2021-08-25 LAB — LIPASE, BLOOD: Lipase: 24 U/L (ref 11–51)

## 2021-08-25 LAB — RESP PANEL BY RT-PCR (FLU A&B, COVID) ARPGX2
Influenza A by PCR: NEGATIVE
Influenza B by PCR: NEGATIVE
SARS Coronavirus 2 by RT PCR: NEGATIVE

## 2021-08-25 LAB — TROPONIN I (HIGH SENSITIVITY)
Troponin I (High Sensitivity): 3 ng/L (ref <18)
Troponin I (High Sensitivity): 3 ng/L (ref <18)

## 2021-08-25 LAB — ETHANOL: Alcohol, Ethyl (B): <10 mg/dL (ref <10)

## 2021-08-25 LAB — CBG MONITORING, ED: Glucose-Capillary: 168 mg/dL — ABNORMAL HIGH (ref 70–99)

## 2021-08-25 LAB — APTT: aPTT: 28 seconds (ref 24–36)

## 2021-08-25 LAB — SALICYLATE LEVEL: Salicylate Lvl: <7 mg/dL — ABNORMAL LOW (ref 7.0–30.0)

## 2021-08-25 LAB — AMMONIA: Ammonia: <10 umol/L (ref 9–35)

## 2021-08-25 LAB — BRAIN NATRIURETIC PEPTIDE: B Natriuretic Peptide: 32.7 pg/mL (ref 0.0–100.0)

## 2021-08-25 MED ORDER — ACETAMINOPHEN 650 MG RE SUPP: 650.0000 mg | RECTAL | Status: DC | PRN

## 2021-08-25 MED ORDER — SIMVASTATIN 10 MG PO TABS
40.0000 mg | ORAL_TABLET | Freq: Every day | ORAL | Status: DC
  Administered 2021-08-25: 40 mg via ORAL
  Filled 2021-08-25: qty 4

## 2021-08-25 MED ORDER — STROKE: EARLY STAGES OF RECOVERY BOOK
Freq: Once | Status: DC
Start: 2021-08-25 — End: 2021-08-26

## 2021-08-25 MED ORDER — ACETAMINOPHEN 160 MG/5ML PO SOLN: 650.0000 mg | ORAL | Status: DC | PRN

## 2021-08-25 MED ORDER — GADOBUTROL 1 MMOL/ML IV SOLN
10.0000 mL | Freq: Once | INTRAVENOUS | Status: AC | PRN
  Administered 2021-08-25: 10 mL via INTRAVENOUS

## 2021-08-25 MED ORDER — SODIUM CHLORIDE 0.9% FLUSH
3.0000 mL | Freq: Once | INTRAVENOUS | Status: AC
  Administered 2021-08-25: 3 mL via INTRAVENOUS

## 2021-08-25 MED ORDER — SODIUM CHLORIDE 0.9 % IV SOLN: INTRAVENOUS | Status: DC

## 2021-08-25 MED ORDER — ASPIRIN 81 MG PO TBEC
81.0000 mg | DELAYED_RELEASE_TABLET | Freq: Every day | ORAL | Status: DC
  Administered 2021-08-25 – 2021-08-26 (×2): 81 mg via ORAL
  Filled 2021-08-25 (×2): qty 1

## 2021-08-25 MED ORDER — IOHEXOL 350 MG/ML SOLN
75.0000 mL | Freq: Once | INTRAVENOUS | Status: AC | PRN
  Administered 2021-08-25: 75 mL via INTRAVENOUS

## 2021-08-25 MED ORDER — ACETAMINOPHEN 325 MG PO TABS: 650.0000 mg | ORAL_TABLET | ORAL | Status: DC | PRN

## 2021-08-25 NOTE — ED Notes (Signed)
Staff from facility at bedside. 

## 2021-08-25 NOTE — H&P (Signed)
History and Physical    Tyrone Little YNW:[id number] DOB: 1960-08-24 DOA: 08/25/2021  PCP: Berniece Salines, FNP  Patient coming from:group home  I have personally briefly reviewed patient's old medical records in [facility]  Chief Complaint: change in ms slurred speech   HPI: Tyrone Little is a 61 y.o. male with medical history significant of schizofaffective d/o, developmental delay ,resident of a group home who presents with dysarthria and confusion with LKW  0830. He presented to ed as code stroke. No further history able to be obtained due to patient obtunded state.  Of note at baseline patient is ambulatory and able to complete ADLs. Patient in ed currently is much improved he is able to give some history but has garbled speech. Which it appears patient has the habit of doing this intermittently. When asked to speak clearly by his attendant he does so. He does noted increase urination as one of his complaints and feeling that his abdomen is full.  He notes no n/v/f/ or sob, he does endorse shoulder pain.  ED Course:  His initial CT head and CTA were negative but due to timing he was outside window for thrombolytics.  Patient evaluated by neurology who  recommends MRI to r/o CVA , however due to patient history of similar presentation on last admission , currently favor diagnosis of toxic metabolic encephalopathy and less likely sz or cva.   Vitals: in ed   Labs: Wbc 6.4, hgb 14.3, pt 293 gly 168  Na 139, K 3.7, gly 178 cr 0.78  Lactic 0.9 E4th o<10 Ce 3  Respiratory panel neg Vbg: 7.3/55 Review of Systems: As per HPI otherwise 10 point review of systems negative.   Past Medical History:  Diagnosis Date   Schizotaxia     History reviewed. No pertinent surgical history.   reports that he quit smoking about 16 years ago. His smoking use included cigarettes. He has never used smokeless tobacco. He reports that he does not drink alcohol and does not use drugs.  No Known  Allergies  History reviewed. No pertinent family history.  Prior to Admission medications   Medication Sig Start Date End Date Taking? Authorizing Provider  cetirizine (ZYRTEC) 10 MG tablet Take 1 tablet (10 mg total) by mouth daily. 08/13/21  Yes Berniece Salines, FNP  Cholecalciferol (VITAMIN D3) 125 MCG (5000 UT) CAPS TAKE 1 CAPSULE BY MOUTH ONCE DAILY  *TAKE WITH FOOD* Patient taking differently: Take 5,000 Units by mouth daily. 06/04/21  Yes Berniece Salines, FNP  fluPHENAZine (PROLIXIN) 5 MG tablet Take 5 mg by mouth at bedtime. 03/23/21  Yes [provider]  linaclotide Karlene Einstein) 145 MCG CAPS capsule Take 1 capsule (145 mcg total) by mouth daily before breakfast. 08/13/21  Yes Berniece Salines, FNP  lubiprostone (AMITIZA) 24 MCG capsule Take 1 capsule (24 mcg total) by mouth 2 (two) times daily with a meal. 08/13/21  Yes Berniece Salines, FNP  Multiple Vitamin (THEREMS) TABS Take 1 tablet by mouth daily. 08/13/21  Yes Berniece Salines, FNP  Omega-3 1000 MG CAPS Take 1 capsule (1,000 mg total) by mouth daily. 08/13/21  Yes Berniece Salines, FNP  pioglitazone (ACTOS) 15 MG tablet Take 1 tablet (15 mg total) by mouth daily. 08/13/21  Yes Berniece Salines, FNP  QUEtiapine (SEROQUEL) 200 MG tablet Take 200 mg by mouth at bedtime. 03/23/21  Yes [provider]  risperiDONE (RISPERDAL) 2 MG tablet Take 2 mg by mouth 2 (two) times  daily. 03/23/21  Yes [provider]  simvastatin (ZOCOR) 40 MG tablet Take 1 tablet (40 mg total) by mouth at bedtime. 08/13/21  Yes Berniece Salines, FNP  tamsulosin (FLOMAX) 0.4 MG CAPS capsule Take 1 capsule (0.4 mg total) by mouth daily. 08/13/21  Yes Berniece Salines, FNP  acetaminophen (TYLENOL) 500 MG tablet Take 2 tablets (1,000 mg total) by mouth every 8 (eight) hours as needed for fever (pain). 08/13/21   Berniece Salines, FNP  amoxicillin-clavulanate (AUGMENTIN) 875-125 MG tablet Take 1 tablet by mouth every 12 (twelve) hours. Patient not taking:  Reported on 08/25/2021 08/06/21   Alberteen Sam, MD  hydrOXYzine (ATARAX) 50 MG tablet Take 50-100 mg by mouth See admin instructions. 50mg  daily (as needed for anxiety/agitation), 100 mg at bedtime as needed (no indication given) Patient not taking: Reported on 08/25/2021 12/23/20   [provider]  ibuprofen (ADVIL) 600 MG tablet Take 1 tablet (600 mg total) by mouth every 6 (six) hours as needed (pain). 08/13/21   08/15/21, FNP  OVER THE COUNTER MEDICATION Take 1 Scoop by mouth daily. Best Fiber Powder    [provider]    Physical Exam: Vitals:   08/25/21 1416 08/25/21 1430 08/25/21 1533 08/25/21 1717  BP:  125/75 (!) 146/93 (!) 155/94  Pulse:   60 60  Resp:  18 18 20   Temp:      TempSrc:      SpO2:  98% 97% 97%  Weight: 106.9 kg     Height: 5\' 11"  (1.803 m)        Vitals:   08/25/21 1416 08/25/21 1430 08/25/21 1533 08/25/21 1717  BP:  125/75 (!) 146/93 (!) 155/94  Pulse:   60 60  Resp:  18 18 20   Temp:      TempSrc:      SpO2:  98% 97% 97%  Weight: 106.9 kg     Height: 5\' 11"  (1.803 m)     Constitutional: NAD, calm, comfortable Eyes: PERRL, lids and conjunctivae normal ENMT: Mucous membranes are moist. Posterior pharynx clear of any exudate or lesions.poor dentition.  Neck: normal, supple, no masses, no thyromegaly Respiratory: clear to auscultation bilaterally, no wheezing, no crackles. Normal respiratory effort. No accessory muscle use.  Cardiovascular: Regular rate and rhythm, no murmurs / rubs / gallops. No extremity edema. 2+ pedal pulses.  Abdomen: no tenderness, no masses palpated. No hepatosplenomegaly. Bowel sounds positive.  Musculoskeletal: no clubbing / cyanosis. No joint deformity upper and lower extremities. Good ROM, no contractures. Normal muscle tone.  Skin: no rashes, lesions, ulcers. No induration Neurologic: CN 2-12 grossly intact. Sensation intact, DTR normal. Strength 5/5 in all 4.  Psychiatric: Normal judgment and  insight. Alert and oriented x 3. Normal mood.    Labs on Admission: I have personally reviewed following labs and imaging studies  CBC: Recent Labs  Lab 08/25/21 1335  WBC 6.4  NEUTROABS 5.3  HGB 14.3  HCT 43.9  MCV 86.8  PLT 293   Basic Metabolic Panel: Recent Labs  Lab 08/25/21 1335  NA 139  K 3.7  CL 104  CO2 27  GLUCOSE 178*  BUN 16  CREATININE 0.78  CALCIUM 8.8*   GFR: Estimated Creatinine Clearance: 123.6 mL/min (by C-G formula based on SCr of 0.78 mg/dL). Liver Function Tests: Recent Labs  Lab 08/25/21 1335  AST 15  ALT 14  ALKPHOS 66  BILITOT 0.8  PROT 6.8  ALBUMIN 3.8   Recent Labs  Lab 08/25/21 1444  LIPASE 24   No results for input(s): AMMONIA in the last 168 hours. Coagulation Profile: Recent Labs  Lab 08/25/21 1335  INR 1.1   Cardiac Enzymes: No results for input(s): CKTOTAL, CKMB, CKMBINDEX, TROPONINI in the last 168 hours. BNP (last 3 results) No results for input(s): PROBNP in the last 8760 hours. HbA1C: No results for input(s): HGBA1C in the last 72 hours. CBG: Recent Labs  Lab 08/25/21 1335  GLUCAP 168*   Lipid Profile: No results for input(s): CHOL, HDL, LDLCALC, TRIG, CHOLHDL, LDLDIRECT in the last 72 hours. Thyroid Function Tests: No results for input(s): TSH, T4TOTAL, FREET4, T3FREE, THYROIDAB in the last 72 hours. Anemia Panel: No results for input(s): VITAMINB12, FOLATE, FERRITIN, TIBC, IRON, RETICCTPCT in the last 72 hours. Urine analysis:    Component Value Date/Time   COLORURINE YELLOW (A) 08/01/2021 1048   APPEARANCEUR CLEAR (A) 08/01/2021 1048   LABSPEC 1.034 (H) 08/01/2021 1048   PHURINE 6.0 08/01/2021 1048   GLUCOSEU NEGATIVE 08/01/2021 1048   HGBUR NEGATIVE 08/01/2021 1048   BILIRUBINUR NEGATIVE 08/01/2021 1048   KETONESUR NEGATIVE 08/01/2021 1048   PROTEINUR NEGATIVE 08/01/2021 1048   NITRITE NEGATIVE 08/01/2021 1048   LEUKOCYTESUR NEGATIVE 08/01/2021 1048    Radiological Exams on Admission: DG  Chest Portable 1 View  Result Date: 08/25/2021 CLINICAL DATA:  Weakness, aphasia EXAM: PORTABLE CHEST 1 VIEW COMPARISON:  08/04/2021 FINDINGS: Single frontal view of the chest demonstrates an unremarkable cardiac silhouette. Low lung volumes with crowding of the central vasculature. No airspace disease, effusion, or pneumothorax. No acute bony abnormalities. IMPRESSION: 1. Low lung volumes.  No acute airspace disease. Electronically Signed   By: Sharlet Salina M.D.   On: 08/25/2021 15:51   CT HEAD CODE STROKE WO CONTRAST  Result Date: 08/25/2021 CLINICAL DATA:  Code stroke.  Neuro deficit, acute, stroke suspected EXAM: CT HEAD WITHOUT CONTRAST TECHNIQUE: Contiguous axial images were obtained from the base of the skull through the vertex without intravenous contrast. RADIATION DOSE REDUCTION: This exam was performed according to the departmental dose-optimization program which includes automated exposure control, adjustment of the mA and/or kV according to patient size and/or use of iterative reconstruction technique. COMPARISON:  CT head May 3rd, 2023. FINDINGS: Brain: No evidence of acute infarction, hemorrhage, hydrocephalus, extra-axial collection or mass lesion/mass effect. Partially empty sella. Vascular: No hyperdense vessel identified. Skull: No acute fracture. Sinuses/Orbits: Clear sinuses.  No acute orbital findings. Other: No mastoid effusions. ASPECTS [facility] Stroke Program Early CT Score) total score (0-10 with 10 being normal): 10. IMPRESSION: 1. No evidence of acute intracranial abnormality.ASPECTS is 10. 2. Partially empty sella, which is often a normal anatomic variant but can be associated with idiopathic intracranial hypertension (pseudotumor cerebri). Code stroke imaging results were communicated on 08/25/2021 at 2:06 pm to provider [facility] via secure text paging. Electronically Signed   By: Feliberto Harts M.D.   On: 08/25/2021 14:09   CT ANGIO HEAD NECK W WO CM (CODE STROKE)  Result  Date: 08/25/2021 CLINICAL DATA:  Neuro deficit, acute, stroke suspected EXAM: CT ANGIOGRAPHY HEAD AND NECK TECHNIQUE: Multidetector CT imaging of the head and neck was performed using the standard protocol during bolus administration of intravenous contrast. Multiplanar CT image reconstructions and MIPs were obtained to evaluate the vascular anatomy. Carotid stenosis measurements (when applicable) are obtained utilizing NASCET criteria, using the distal internal carotid diameter as the denominator. RADIATION DOSE REDUCTION: This exam was performed according to the departmental dose-optimization program which includes automated exposure  control, adjustment of the mA and/or kV according to patient size and/or use of iterative reconstruction technique. CONTRAST:  75 mL of Omnipaque 350 IV. COMPARISON:  CTA Aug 01, 2021. FINDINGS: CTA NECK FINDINGS Aortic arch: Great vessel origins are patent without significant stenosis Right carotid system: Atherosclerosis at the carotid bifurcation without greater than 50% stenosis. Tortuosity of the ICA at the skull base. Left carotid system: Atherosclerosis at the carotid bifurcation without greater than 50% stenosis. Tortuosity of the ICA at the skull base. Vertebral arteries: Slightly left dominant. Bilateral vertebral arteries are patent without significant (greater than 50%) stenosis. Skeleton: Moderate to severe lower cervical degenerative disc disease. Other neck: Subcentimeter thyroid nodules, which do not require further imaging follow-up. (Ref: J Am Coll Radiol. 2015 Feb;12(2): 143-50). Upper chest: Visualized lung apices are clear.  Knee Review of the MIP images confirms the above findings CTA HEAD FINDINGS Anterior circulation: Calcific atherosclerosis of bilateral intracranial ICAs without significant stenosis. Bilateral MCAs and ACAs are patent without proximal hemodynamically significant stenosis. Posterior circulation: Atherosclerosis of bilateral vertebral arteries  at their dural margin. Both intradural vertebral arteries, basilar artery and bilateral posterior cerebral arteries are patent without proximal hemodynamically significant stenosis. Venous sinuses: As permitted by contrast timing, patent. Review of the MIP images confirms the above findings IMPRESSION: No emergent large vessel occlusion or proximal hemodynamically significant stenosis in the head or neck. Electronically Signed   By: Feliberto HartsFrederick S Jones M.D.   On: 08/25/2021 14:15    EKG: Independently reviewed.   Assessment/Plan  Acute Encephalopathy  -admit to progressive care  -r/o tia/cva vs toxic metabolic etiology -place on tia /cva protocol - ct head/cta negative  -mri pending  -continue with neuro checks,  -OT,PT,SLP    -asa -less likely sz , place on precautions with low threshold for initiation of AED if patient has sz like activity , EEG ordered -UDS /UApending  -vbg notes mild hypercarbia, will repeat abg for further evaluation  -ammonia pending  -possible medication related will hold psych/ sedating medications currently  -f/u further neuro recs    Schizofaffective d/o - hold psych meds currently  -resume as able   BPH -resume flomax as able  -bladder scan pending  DVT prophylaxis: lovenox  Code Status: full Family Communication: n/a Disposition Plan: patient  expected to be admitted greater than 2 midnights  Consults called: neurology Dr Darlyn ReadStacks Admission status: inpatient   Lurline DelSara-Maiz A Arletta Lumadue MD [facility]   If 7PM-7AM, please contact night-coverage [email or web address] Password [email or web address]  08/25/2021, 6:10 PM

## 2021-08-25 NOTE — Progress Notes (Addendum)
CODE STROKE- PHARMACY COMMUNICATION   Time CODE STROKE called/page received: 1323  Time response to CODE STROKE was made (in person or via phone): 1330  Time Stroke Kit retrieved from [facility] (only if needed): not needed  Name of Provider/Nurse contacted: Dr. Quinn Axe  Past Medical History:  Diagnosis Date   Schizotaxia    Prior to Admission medications   Medication Sig Start Date End Date Taking? Authorizing Provider  cetirizine (ZYRTEC) 10 MG tablet Take 1 tablet (10 mg total) by mouth daily. 08/13/21  Yes Bo Merino, FNP  Cholecalciferol (VITAMIN D3) 125 MCG (5000 UT) CAPS TAKE 1 CAPSULE BY MOUTH ONCE DAILY  *TAKE WITH FOOD* Patient taking differently: Take 5,000 Units by mouth daily. 06/04/21  Yes Bo Merino, FNP  fluPHENAZine (PROLIXIN) 5 MG tablet Take 5 mg by mouth at bedtime. 03/23/21  Yes [provider]  linaclotide Rolan Lipa) 145 MCG CAPS capsule Take 1 capsule (145 mcg total) by mouth daily before breakfast. 08/13/21  Yes Bo Merino, FNP  lubiprostone (AMITIZA) 24 MCG capsule Take 1 capsule (24 mcg total) by mouth 2 (two) times daily with a meal. 08/13/21  Yes Bo Merino, FNP  Multiple Vitamin (THEREMS) TABS Take 1 tablet by mouth daily. 08/13/21  Yes Bo Merino, FNP  Omega-3 1000 MG CAPS Take 1 capsule (1,000 mg total) by mouth daily. 08/13/21  Yes Bo Merino, FNP  pioglitazone (ACTOS) 15 MG tablet Take 1 tablet (15 mg total) by mouth daily. 08/13/21  Yes Bo Merino, FNP  QUEtiapine (SEROQUEL) 200 MG tablet Take 200 mg by mouth at bedtime. 03/23/21  Yes [provider]  risperiDONE (RISPERDAL) 2 MG tablet Take 2 mg by mouth 2 (two) times daily. 03/23/21  Yes [provider]  simvastatin (ZOCOR) 40 MG tablet Take 1 tablet (40 mg total) by mouth at bedtime. 08/13/21  Yes Bo Merino, FNP  tamsulosin (FLOMAX) 0.4 MG CAPS capsule Take 1 capsule (0.4 mg total) by mouth daily. 08/13/21  Yes Bo Merino, FNP  acetaminophen  (TYLENOL) 500 MG tablet Take 2 tablets (1,000 mg total) by mouth every 8 (eight) hours as needed for fever (pain). 08/13/21   Bo Merino, FNP  amoxicillin-clavulanate (AUGMENTIN) 875-125 MG tablet Take 1 tablet by mouth every 12 (twelve) hours. Patient not taking: Reported on 08/25/2021 08/06/21   Edwin Dada, MD  hydrOXYzine (ATARAX) 50 MG tablet Take 50-100 mg by mouth See admin instructions. 67m daily (as needed for anxiety/agitation), 100 mg at bedtime as needed (no indication given) Patient not taking: Reported on 08/25/2021 12/23/20   [provider]  ibuprofen (ADVIL) 600 MG tablet Take 1 tablet (600 mg total) by mouth every 6 (six) hours as needed (pain). 08/13/21   PBo Merino FNP  OVER THE COUNTER MEDICATION Take 1 Scoop by mouth daily. Best Fiber Powder    [provider]   CWynelle Cleveland PharmD Pharmacy Resident  08/25/2021 2:42 PM

## 2021-08-25 NOTE — Consult Note (Signed)
NEUROLOGY CONSULTATION NOTE   Date of service: Aug 25, 2021 Patient Name: Tyrone Little MRN:  [id number] DOB:  11/12/1960 Reason for consult: stroke code Requesting physician: Dr. Dorothea Glassman _ _ _   _ __   _ __ _ _  __ __   _ __   __ _  History of Present Illness   This is a 61 yo gentleman with hx schizophrenia who presents from group home for acute onset severe dysarthria and AMS. LKW 0830. CT head NAICP. TNK not administered 2/2 presentation outside of the window. CT head NAICP on personal review. CTA no LVO. CTP no perfusion deficit. At baseline patient requires assistance with complex ADLs but walks independently. Currently has difficulty holding up both legs and seems lethargic. Patient presented earlier this month with similar presentation and actually had to be intubated and transferred to [facility]. Neurologic workup at that point was unrevealing. Seizure was on the differential but no spells were captured on EEG.    ROS   UTA 2/2 encephalopathy  Past History   I have reviewed the following:  Past Medical History:  Diagnosis Date   Schizotaxia    History reviewed. No pertinent surgical history. History reviewed. No pertinent family history. Social History   Socioeconomic History   Marital status: Single    Spouse name: Not on file   Number of children: Not on file   Years of education: Not on file   Highest education level: Not on file  Occupational History   Not on file  Tobacco Use   Smoking status: Former    Types: Cigarettes    Quit date: 2007    Years since quitting: 16.4   Smokeless tobacco: Never  Vaping Use   Vaping Use: Never used  Substance and Sexual Activity   Alcohol use: Never   Drug use: Never   Sexual activity: Never  Other Topics Concern   Not on file  Social History Narrative   [facility] [address], [place], [place]  [phone]   Social Determinants of Health   Financial Resource Strain: Low Risk    Difficulty of Paying Living  Expenses: Not hard at all  Food Insecurity: No Food Insecurity   Worried About Programme researcher, broadcasting/film/video in the Last Year: Never true   Ran Out of Food in the Last Year: Never true  Transportation Needs: No Transportation Needs   Lack of Transportation (Medical): No   Lack of Transportation (Non-Medical): No  Physical Activity: Sufficiently Active   Days of Exercise per Week: 7 days   Minutes of Exercise per Session: 30 min  Stress: No Stress Concern Present   Feeling of Stress : Not at all  Social Connections: Moderately Integrated   Frequency of Communication with Friends and Family: More than three times a week   Frequency of Social Gatherings with Friends and Family: Not on file   Attends Religious Services: More than 4 times per year   Active Member of [employer] or Organizations: Yes   Attends Banker Meetings: Not on file   Marital Status: Never married   No Known Allergies  Medications   (Not in a hospital admission)    No current facility-administered medications for this encounter.  Current Outpatient Medications:    cetirizine (ZYRTEC) 10 MG tablet, Take 1 tablet (10 mg total) by mouth daily., Disp: 90 tablet, Rfl: 1   Cholecalciferol (VITAMIN D3) 125 MCG (5000 UT) CAPS, TAKE 1 CAPSULE BY MOUTH ONCE  DAILY  *TAKE WITH FOOD* (Patient taking differently: Take 5,000 Units by mouth daily.), Disp: 28 capsule, Rfl: 11   fluPHENAZine (PROLIXIN) 5 MG tablet, Take 5 mg by mouth at bedtime., Disp: , Rfl:    linaclotide (LINZESS) 145 MCG CAPS capsule, Take 1 capsule (145 mcg total) by mouth daily before breakfast., Disp: 90 capsule, Rfl: 1   lubiprostone (AMITIZA) 24 MCG capsule, Take 1 capsule (24 mcg total) by mouth 2 (two) times daily with a meal., Disp: 90 capsule, Rfl: 1   Multiple Vitamin (THEREMS) TABS, Take 1 tablet by mouth daily., Disp: 90 tablet, Rfl: 1   Omega-3 1000 MG CAPS, Take 1 capsule (1,000 mg total) by mouth daily., Disp: 90 capsule, Rfl: 1   pioglitazone  (ACTOS) 15 MG tablet, Take 1 tablet (15 mg total) by mouth daily., Disp: 90 tablet, Rfl: 1   QUEtiapine (SEROQUEL) 200 MG tablet, Take 200 mg by mouth at bedtime., Disp: , Rfl:    risperiDONE (RISPERDAL) 2 MG tablet, Take 2 mg by mouth 2 (two) times daily., Disp: , Rfl:    simvastatin (ZOCOR) 40 MG tablet, Take 1 tablet (40 mg total) by mouth at bedtime., Disp: 90 tablet, Rfl: 1   tamsulosin (FLOMAX) 0.4 MG CAPS capsule, Take 1 capsule (0.4 mg total) by mouth daily., Disp: 90 capsule, Rfl: 1   acetaminophen (TYLENOL) 500 MG tablet, Take 2 tablets (1,000 mg total) by mouth every 8 (eight) hours as needed for fever (pain)., Disp: 90 tablet, Rfl: 1   amoxicillin-clavulanate (AUGMENTIN) 875-125 MG tablet, Take 1 tablet by mouth every 12 (twelve) hours. (Patient not taking: Reported on 08/25/2021), Disp: 3 tablet, Rfl: 0   hydrOXYzine (ATARAX) 50 MG tablet, Take 50-100 mg by mouth See admin instructions. 50mg  daily (as needed for anxiety/agitation), 100 mg at bedtime as needed (no indication given) (Patient not taking: Reported on 08/25/2021), Disp: , Rfl:    ibuprofen (ADVIL) 600 MG tablet, Take 1 tablet (600 mg total) by mouth every 6 (six) hours as needed (pain)., Disp: 90 tablet, Rfl: 1   OVER THE COUNTER MEDICATION, Take 1 Scoop by mouth daily. Best Fiber Powder, Disp: , Rfl:   Vitals   Vitals:   08/25/21 1416 08/25/21 1430 08/25/21 1533 08/25/21 1717  BP:  125/75 (!) 146/93 (!) 155/94  Pulse:   60 60  Resp:  18 18 20   Temp:      TempSrc:      SpO2:  98% 97% 97%  Weight: 106.9 kg     Height: 5\' 11"  (1.803 m)        Body mass index is 32.87 kg/m.  Physical Exam   Physical Exam Gen: alert, oriented to self only, follows simple commands inconsistently HEENT: Atraumatic, normocephalic;mucous membranes moist; oropharynx clear, tongue without atrophy or fasciculations. Neck: Supple, trachea midline. Resp: CTAB, no w/r/r CV: RRR, no m/g/r; nml S1 and S2. 2+ symmetric peripheral  pulses. Abd: soft/NT/ND; nabs x 4 quad Extrem: Nml bulk; no cyanosis, clubbing, or edema.  Neuro: *MS: alert, oriented to self only, follows simple commands inconsistently *Speech: moderate dysarthria, impaired naming *CN: PERRL, EOMI, blinks to threat bilat, sensation intact, face symmetric, hearing intact to voice *Motor: BUE 5/5 no drift, some movement against gravity but unable to hold either leg off bed for count of 5 *Sensory: SILT *Reflexes: 2+ symm throughout, toes equiv bilat *Coordination, gait: UTA   NIHSS  1a Level of Conscious.: 1 1b LOC Questions: 2 1c LOC Commands: 1 2 Best Gaze: 0  3 Visual: 0 4 Facial Palsy: 0 5a Motor Arm - left: 0 5b Motor Arm - Right: 0 6a Motor Leg - Left: 2 6b Motor Leg - Right: 2 7 Limb Ataxia: 0 8 Sensory: 0 9 Best Language: 1 10 Dysarthria: 1 11 Extinct. and Inatten.: 0  TOTAL: 10   Premorbid mRS = 3   Labs   CBC:  Recent Labs  Lab 08/25/21 1335  WBC 6.4  NEUTROABS 5.3  HGB 14.3  HCT 43.9  MCV 86.8  PLT 293    Basic Metabolic Panel:  Lab Results  Component Value Date   NA 139 08/25/2021   K 3.7 08/25/2021   CO2 27 08/25/2021   GLUCOSE 178 (H) 08/25/2021   BUN 16 08/25/2021   CREATININE 0.78 08/25/2021   CALCIUM 8.8 (L) 08/25/2021   GFRNONAA >60 08/25/2021   Lipid Panel:  Lab Results  Component Value Date   LDLCALC 70 05/09/2021   HgbA1c:  Lab Results  Component Value Date   HGBA1C 5.4 08/02/2021   Urine Drug Screen:     Component Value Date/Time   LABOPIA NONE DETECTED 08/01/2021 1048   COCAINSCRNUR NONE DETECTED 08/01/2021 1048   LABBENZ NONE DETECTED 08/01/2021 1048   AMPHETMU NONE DETECTED 08/01/2021 1048   THCU NONE DETECTED 08/01/2021 1048   LABBARB NONE DETECTED 08/01/2021 1048    Alcohol Level     Component Value Date/Time   ETH <10 08/25/2021 1444     Impression   This is a 61 yo gentleman with hx schizophrenia who presents from group home for acute onset severe dysarthria and  AMS and BLE weakness. Exam overall nonfocal, favor encephalopathy over acute ischemic infarct. Recommend workup for metabolic or infectious etiologies per ED. Patient presented earlier this month with similar presentation and actually had [facility] be [facility]ub[facility] and [facility] Neurologic workup at that point was unrevealing. Seizure was on the differential but no spells were captured on EEG. Recommend brain MRI wwo contrast as well as workup for metabolic/infectious process per ED. If no cause is identified and patient improves to baseline without intervention, will consider starting empiric keppra for possible seizure disorder. If MRI brain negative for acute infarct, no further stroke workup indicated.   Recommendations   - MRI brain with and without contrast - Workup for metabolic or infectious etiologies per ED - Further guidance based on MRI results ______________________________________________________________________   Thank you for the opportunity to take part in the care of this patient. If you have any further questions, please contact the neurology consultation attending.  Signed,  Bing Neighborsolleen Declynn Lopresti, MD [facility] [phone]  If 7pm- 7am, please page neurology on call as listed in AMION.

## 2021-08-25 NOTE — ED Provider Notes (Signed)
[facility] Provider Note    Event Date/Time   First MD Initiated Contact with Patient 08/25/21 1405     (approximate)   History   Chief complaint is code stroke.   HPI  Tyrone Little is a 61 y.o. male who comes from a group home where he lives for his schizophrenia.  He had gotten up and was fine went to eat and after eating said he did not feel good and asked to be wheeled back to his bed which he was.  At about 830 he went down to take a nap his caregiver checked on him twice he seemed to be doing okay he got up at 830 came out and want to talk to them and had slurry speech.  On arrival here he is sleepy but arousable his speech is difficult to understand he can follow many commands but does not follow all of them.  He has trouble lifting both of his legs up.      Physical Exam   Triage Vital Signs: ED Triage Vitals  Enc Vitals Group     BP      Pulse      Resp      Temp      Temp src      SpO2      Weight      Height      Head Circumference      Peak Flow      Pain Score      Pain Loc      Pain Edu?      Excl. in GC?     Most recent vital signs: Vitals:   08/25/21 1430 08/25/21 1533  BP: 125/75 (!) 146/93  Pulse:  60  Resp: 18 18  Temp:    SpO2: 98% 97%     General: Sleepy but arousable Head normocephalic atraumatic Eyes pupils equal round reactive extraocular movements intact although patient has trouble following commands to move his eyes back-and-forth Mouth no erythema or exudate CV:  Good peripheral perfusion.  Heart regular rate and rhythm no audible murmurs Resp:  Normal effort.  Lungs are clear Abd:  No distention.  Soft no palpable organomegaly patient says his belly is "a little  bit sore" Extremities: No edema Neuro exam cranial nerves II through XII appear to be intact but it is difficult to tell because patient is not following commands well.  Extraocular movements appear to be intact but I do not see any nystagmus.   Visual fields were not checked.  Patient is unable to do cerebellar testing not sure why this is.   ED Results / Procedures / Treatments   Labs (all labs ordered are listed, but only abnormal results are displayed) Labs Reviewed  COMPREHENSIVE METABOLIC PANEL - Abnormal; Notable for the following components:      Result Value   Glucose, Bld 178 (*)    Calcium 8.8 (*)    All other components within normal limits  ACETAMINOPHEN LEVEL - Abnormal; Notable for the following components:   Acetaminophen (Tylenol), Serum <10 (*)    All other components within normal limits  SALICYLATE LEVEL - Abnormal; Notable for the following components:   Salicylate Lvl <7.0 (*)    All other components within normal limits  CBG MONITORING, ED - Abnormal; Notable for the following components:   Glucose-Capillary 168 (*)    All other components within normal limits  RESP PANEL BY RT-PCR (FLU A&B, COVID) ARPGX2  PROTIME-INR  APTT  CBC  DIFFERENTIAL  BRAIN NATRIURETIC PEPTIDE  ETHANOL  LACTIC ACID, PLASMA  LIPASE, BLOOD  BLOOD GAS, VENOUS  LACTIC ACID, PLASMA  URINALYSIS, ROUTINE W REFLEX MICROSCOPIC  URINE DRUG SCREEN, QUALITATIVE ([facility] ONLY)  CBG MONITORING, ED  TROPONIN I (HIGH SENSITIVITY)  TROPONIN I (HIGH SENSITIVITY)     EKG  EKG read and interpreted by me shows normal sinus rhythm rate of 68 normal axis no acute ST-T wave changes   RADIOLOGY CT and CT angiogram read by radiology I did review and interpret the CT.  No acute changes were seen.   PROCEDURES:  Critical Care performed: Critical care time 20 minutes.  This includes seeing the patient in the lobby and in CT scan and speaking to the neurologist 3 times and then talking to the hospitalist.  Additionally they have reviewed the patient's studies.  I also reviewed some of his old records.  Procedures   MEDICATIONS ORDERED IN ED: Medications  sodium chloride flush (NS) 0.9 % injection 3 mL (3 mLs Intravenous Given 08/25/21  1433)  iohexol (OMNIPAQUE) 350 MG/ML injection 75 mL (75 mLs Intravenous Contrast Given 08/25/21 1413)     IMPRESSION / MDM / ASSESSMENT AND PLAN / ED COURSE  I reviewed the triage vital signs and the nursing notes.  ----------------------------------------- 3:49 PM on 08/25/2021 ----------------------------------------- Patient seen by myself and neurology as he came in into the lobby.  I then went to CT to check on him and the neurologist was there.  Neurology came by and spoke with me afterwards.  The patient may have had a stroke or may have had altered mental status from another cause.  He is still off altered at this point.  His speech is still slurred and difficult to understand.  We will get him in the hospital do a stroke work-up and evaluate him for other possible causes. Differential diagnosis includes, but is not limited to, possible stroke or TIA, accidental therapeutic drug overdose, street drug overdose, side effect of schizophrenia, other cause of altered mental status is unlikely.  There is no obvious sign of sepsis or other infection.  Patient's presentation is most consistent with acute presentation with potential threat to life or bodily function.  {he patient is on the cardiac monitor to evaluate for evidence of arrhythmia and/or significant heart rate changes.  None have been seen      FINAL CLINICAL IMPRESSION(S) / ED DIAGNOSES   Final diagnoses:  Slurred speech  Altered mental status, unspecified altered mental status type     Rx / DC Orders   ED Discharge Orders     None        Note:  This document was prepared using Dragon voice recognition software and may include unintentional dictation errors.   Arnaldo Natal, MD 08/25/21 [phone]

## 2021-08-25 NOTE — ED Notes (Signed)
RN sent down rainbow of labs.

## 2021-08-25 NOTE — Progress Notes (Signed)
Chaplain responded to code stroke. Chaplain offered compassionate presence. Please call Chaplain if needed.

## 2021-08-25 NOTE — ED Notes (Signed)
This RN recollected all labs as lab cannot find the labs that were sent down when code stroke arrived.

## 2021-08-25 NOTE — ED Triage Notes (Addendum)
BIB acems from group home. EMS activated CODE STROKE for weakness and aphasia. Pt has been seen previously recent on 08/01/21 for same. EMS did not provide any recent vital signs. And no contact information from group home or current known medical history. LKW 0933 this morning per EMS and staff at facilty.

## 2021-08-26 DIAGNOSIS — G9340 Encephalopathy, unspecified: Secondary | ICD-10-CM

## 2021-08-26 DIAGNOSIS — R4781 Slurred speech: Secondary | ICD-10-CM | POA: Diagnosis not present

## 2021-08-26 MED ORDER — LINACLOTIDE 145 MCG PO CAPS: 145.0000 ug | ORAL_CAPSULE | Freq: Every day | ORAL | Status: DC

## 2021-08-26 MED ORDER — TAMSULOSIN HCL 0.4 MG PO CAPS
0.4000 mg | ORAL_CAPSULE | Freq: Every day | ORAL | Status: DC
  Administered 2021-08-26: 0.4 mg via ORAL
  Filled 2021-08-26: qty 1

## 2021-08-26 MED ORDER — DIVALPROEX SODIUM 125 MG PO CSDR
500.0000 mg | DELAYED_RELEASE_CAPSULE | Freq: Two times a day (BID) | ORAL | 2 refills | Status: AC
Start: 2021-08-26 — End: 2023-12-29

## 2021-08-26 MED ORDER — QUETIAPINE FUMARATE 200 MG PO TABS: 200.0000 mg | ORAL_TABLET | Freq: Every day | ORAL | Status: DC

## 2021-08-26 MED ORDER — FLUPHENAZINE HCL 5 MG PO TABS: 5.0000 mg | ORAL_TABLET | Freq: Every day | ORAL | Status: DC

## 2021-08-26 MED ORDER — DIVALPROEX SODIUM 125 MG PO CSDR: 500.0000 mg | DELAYED_RELEASE_CAPSULE | Freq: Two times a day (BID) | ORAL | Status: DC

## 2021-08-26 MED ORDER — RISPERIDONE 1 MG PO TABS
2.0000 mg | ORAL_TABLET | Freq: Two times a day (BID) | ORAL | Status: DC
Start: 2021-08-26 — End: 2021-08-26
  Administered 2021-08-26: 2 mg via ORAL
  Filled 2021-08-26: qty 2

## 2021-08-26 MED ORDER — PIOGLITAZONE HCL 15 MG PO TABS: 15.0000 mg | ORAL_TABLET | Freq: Every day | ORAL | Status: DC

## 2021-08-26 MED ORDER — LUBIPROSTONE 24 MCG PO CAPS: 24.0000 ug | ORAL_CAPSULE | Freq: Two times a day (BID) | ORAL | Status: DC

## 2021-08-26 MED ORDER — DIVALPROEX SODIUM 125 MG PO CSDR
500.0000 mg | DELAYED_RELEASE_CAPSULE | Freq: Two times a day (BID) | ORAL | Status: DC
  Administered 2021-08-26: 500 mg via ORAL
  Filled 2021-08-26: qty 16
  Filled 2021-08-26: qty 4
  Filled 2021-08-26: qty 12

## 2021-08-26 NOTE — TOC Progression Note (Addendum)
Transition of Care [facility]) - Progression Note    Patient Details  Name: Tyrone Little MRN: [id number] Date of Birth: Aug 17, 1960  [facility] CM/SW Contact  Tyrone Little, [place] Phone Number: 08/26/2021, 11:29 AM  Clinical Narrative:     CSW called Tyrone Little to learn HHPT preferences and she reported they already have a HHPT contact and she will reach out to them. Tyrone Little reported they do not have a rolling walker. CSW ordered rolling walker from [place] with [employer].   Tyrone Little informed CSW that patient received rolling walker on the 7th of this month at [facility]. Tyrone Little reported they never received it. Tyrone Little recommended Tyrone Little reach out to insurance to let them know what happened as it will be another 5 years before they cover another one.   No further TOC needs at this time.        Expected Discharge Plan and Services           Expected Discharge Date: 08/26/21                                     Social Determinants of Health (SDOH) Interventions    Readmission Risk Interventions     View : No data to display.

## 2021-08-26 NOTE — Care Management CC44 (Signed)
Condition Code 44 Documentation Completed  Patient Details  Name: Tyrone Little MRN: [id number] Date of Birth: 1960/12/28   Condition Code 44 given:  Yes Patient signature on Condition Code 44 notice:  Yes Documentation of 2 MD's agreement:  Yes Code 44 added to claim:  Yes    Akio Hudnall L Jenesa Foresta, LCSWA 08/26/2021, 11:07 AM

## 2021-08-26 NOTE — Evaluation (Signed)
Occupational Therapy Evaluation Patient Details Name: Tyrone Little MRN: [id number] DOB: [birth year] Today's Date: 08/26/2021   History of Present Illness Pt is a 61 yo male who presented to ED as code stroke with dysarthria and confusion. Pt evaluated by neurology who current favor diagnosis of toxic metabolic encephalopathy (per chart less likely sz of cva). PMH significant for schizoaffective d/o, developmental delay, resident of group home. Per chart pt ambulatory and able to complete ADLs at baseline.   Clinical Impression   Patient presenting with decreased Ind in self care,balance, functional mobility/transfers, endurance, and safety awareness. Per chart review and pt report, pt ambulates at group home without assistance. He is able to perform self care tasks himself but may need some occasional assistance. Staff at group home assist with meals and medications. Patient currently functioning supervision - min A for self care and mobility. He does have 1-2 LOB while ambulating in hallway requiring min A to correct. OT recommends use of RW for additional support. HR increased to 140's with mobility. Patient will benefit from acute OT to increase overall independence in the areas of ADLs, functional mobility, and safety awareness in order to safely discharge to group home. .      Recommendations for follow up therapy are one component of a multi-disciplinary discharge planning process, led by the attending physician.  Recommendations may be updated based on patient status, additional functional criteria and insurance authorization.   Follow Up Recommendations  Home health OT    Assistance Recommended at Discharge Frequent or constant Supervision/Assistance  Patient can return home with the following A little help with walking and/or transfers;A little help with bathing/dressing/bathroom;Assistance with cooking/housework;Assistance with feeding;Direct supervision/assist for medications  management;Assist for transportation;Help with stairs or ramp for entrance    Functional Status Assessment  Patient has had a recent decline in their functional status and demonstrates the ability to make significant improvements in function in a reasonable and predictable amount of time.  Equipment Recommendations  Other (comment) (RW)       Precautions / Restrictions Precautions Precautions: Fall Precaution Comments: Pt unsteady with turns when ambulating, requiring up to min assist from PT to regain balance Restrictions Weight Bearing Restrictions: No      Mobility Bed Mobility Overal bed mobility: Modified Independent             General bed mobility comments: no physical assistance provided but pt uses bed rails and HOB elevated    Transfers Overall transfer level: Needs assistance Equipment used: 1 person hand held assist Transfers: Sit to/from Stand Sit to Stand: Supervision, Min guard                  Balance Overall balance assessment: Mild deficits observed, not formally tested                                         ADL either performed or assessed with clinical judgement   ADL Overall ADL's : Needs assistance/impaired                     Lower Body Dressing: Minimal assistance Lower Body Dressing Details (indicate cue type and reason): to don B shoes Toilet Transfer: Minimal assistance Toilet Transfer Details (indicate cue type and reason): simulated with 1 person HHA                 Vision Baseline  Vision/History: 1 Wears glasses Patient Visual Report: No change from baseline              Pertinent Vitals/Pain Pain Assessment Pain Assessment: Faces Faces Pain Scale: No hurt     Hand Dominance Right   Extremity/Trunk Assessment Upper Extremity Assessment Upper Extremity Assessment: Generalized weakness   Lower Extremity Assessment Lower Extremity Assessment: Generalized weakness        Communication Communication Communication: Expressive difficulties   Cognition Arousal/Alertness: Awake/alert Behavior During Therapy: Flat affect, Impulsive Overall Cognitive Status: History of cognitive impairments - at baseline                                 General Comments: No one present to confirm baseline. Pt is pleasant and cooperative and follows commands with increased time. Pt with hx of developmental delay and cog def at baseline.                Home Living Family/patient expects to be discharged to:: Group home                                 Additional Comments: Pt resident of group home      Prior Functioning/Environment Prior Level of Function : Independent/Modified Independent             Mobility Comments: PLOF per chart: pt independent ambulator without AD, able to complete ADLs ADLs Comments: pt was incontinent, pt indep with dressing and bathing, staff at group home prepared meals, pt fed himself        OT Problem List: Decreased strength;Decreased activity tolerance;Impaired balance (sitting and/or standing);Decreased safety awareness;Decreased knowledge of use of DME or AE      OT Treatment/Interventions: Self-care/ADL training;Therapeutic exercise;Balance training;DME and/or AE instruction;Therapeutic activities;Manual therapy;Energy conservation;Patient/family education    OT Goals(Current goals can be found in the care plan section) Acute Rehab OT Goals Patient Stated Goal: to go back to group home OT Goal Formulation: With patient Time For Goal Achievement: 09/09/21 Potential to Achieve Goals: Good ADL Goals Pt Will Perform Grooming: with supervision;standing Pt Will Perform Upper Body Dressing: with supervision;standing Pt Will Perform Lower Body Dressing: with supervision;sit to/from stand Pt Will Transfer to Toilet: with supervision;ambulating Pt Will Perform Toileting - Clothing Manipulation and hygiene:  with supervision;sit to/from stand  OT Frequency: Min 2X/week       AM-PAC OT "6 Clicks" Daily Activity     Outcome Measure Help from another person eating meals?: A Little Help from another person taking care of personal grooming?: A Little Help from another person toileting, which includes using toliet, bedpan, or urinal?: A Little Help from another person bathing (including washing, rinsing, drying)?: A Little Help from another person to put on and taking off regular upper body clothing?: A Little Help from another person to put on and taking off regular lower body clothing?: A Little 6 Click Score: 18   End of Session Nurse Communication: Mobility status;Other (comment) (HR elevates to 140's with mobility)  Activity Tolerance: Patient tolerated treatment well Patient left: with call bell/phone within reach;in chair;with chair alarm set  OT Visit Diagnosis: Unsteadiness on feet (R26.81);Muscle weakness (generalized) (M62.81)                Time: [id number] OT Time Calculation (min): 8 min Charges:  OT General Charges $OT Visit: 1 Visit OT Evaluation $  OT Eval Moderate Complexity: 1 [address] [place], OTR/L , CBIS ascom [phone]  08/26/21, 11:28 AM

## 2021-08-26 NOTE — Evaluation (Signed)
Physical Therapy Evaluation Patient Details Name: Tyrone Little MRN: [id number] DOB: [birth year] Today's Date: 08/26/2021  History of Present Illness  Pt is a 61 yo male who presented to ED as code stroke with dysarthria and confusion. Pt evaluated by neurology who current favor diagnosis of toxic metabolic encephalopathy (per chart less likely sz of cva). PMH significant for schizoaffective d/o, developmental delay, resident of group home. Per chart pt ambulatory and able to complete ADLs at baseline.  Clinical Impression      Pt referred with the above diagnosis. Pt awake/alert and agreeable to evaluation. Pt able to perform bed mobility independently and complete transfers with SBA due to reports of dizziness upon standing. Pt ambulated approx 95 ft during session, where he required up to min assist with gait due to unsteadiness with turns. Additionally, pt with two brief standing rest breaks during gait to allow for HR decrease (HR >120 bpm). Pt not quite yet at his baseline, but would be appropriate to return to group home with home health with RW for ambulation/balance.    Recommendations for follow up therapy are one component of a multi-disciplinary discharge planning process, led by the attending physician.  Recommendations may be updated based on patient status, additional functional criteria and insurance authorization.  Follow Up Recommendations Home health PT    Assistance Recommended at Discharge PRN  Patient can return home with the following  A little help with walking and/or transfers;A little help with bathing/dressing/bathroom;Direct supervision/assist for medications management;Direct supervision/assist for financial management;Assistance with cooking/housework;Help with stairs or ramp for entrance    Equipment Recommendations Rolling walker (2 wheels) (recommended due to unsteadiness with gait)  Recommendations for Other Services       Functional Status Assessment Patient has  had a recent decline in their functional status and demonstrates the ability to make significant improvements in function in a reasonable and predictable amount of time.     Precautions / Restrictions Precautions Precautions: Fall Precaution Comments: Pt unsteady with turns when ambulating, requiring up to min assist from PT to regain balance Restrictions Weight Bearing Restrictions: No      Mobility  Bed Mobility Overal bed mobility: Independent             General bed mobility comments: observed to independently go from supine>seated>standing, reports some dizziness upon standing    Transfers Overall transfer level: Independent (however, pt monitored for dizziness) Equipment used: None Transfers: Sit to/from Stand Sit to Stand: Supervision           General transfer comment: Supervision due to reports of some dizziness with standing    Ambulation/Gait Ambulation/Gait assistance: Min assist (majority min gaurd throughout, however, up to min asssist due to unsteadiness primarily with turns) Gait Distance (Feet): 95 Feet Assistive device: 1 person hand held assist Gait Pattern/deviations: Staggering left, Staggering right       General Gait Details: mild unsteadiness 2x with turns  Stairs            Wheelchair Mobility    Modified Rankin (Stroke Patients Only)       Balance Overall balance assessment: Mild deficits observed, not formally tested (pt observed standing NBOS and NBOS EC)                                           Pertinent Vitals/Pain Pain Assessment Pain Assessment: 0-10 Pain Score:  (Pt did  not provide number. Reports minor discomfort felt near pec/chest.) Facial Expression: Relaxed, neutral Pain Location: chest/pec, unclear which side Pain Intervention(s): Monitored during session    Home Living Family/patient expects to be discharged to:: Group home                   Additional Comments: Pt resident  of group home    Prior Function Prior Level of Function : Independent/Modified Independent             Mobility Comments: PLOF per chart: pt independent ambulator without AD, able to complete ADLs       Hand Dominance        Extremity/Trunk Assessment                Communication      Cognition Arousal/Alertness: Awake/alert Behavior During Therapy: WFL for tasks assessed/performed Overall Cognitive Status: History of cognitive impairments - at baseline                                          General Comments      Exercises General Exercises - Lower Extremity Ankle Circles/Pumps: AROM, Both, 10 reps Heel Slides: AROM, Both, 10 reps Other Exercises Other Exercises: PT instructed pt in ankle pumps, heel slides to perform while in hospital bed. Additionally, pt ambulated with HHA from room to hallway and back (see above).   Assessment/Plan    PT Assessment All further PT needs can be met in the next venue of care  PT Problem List Decreased activity tolerance;Decreased balance;Decreased mobility;Pain;Decreased strength       PT Treatment Interventions Gait training;Functional mobility training;Therapeutic exercise;Balance training;Stair training;DME instruction;Neuromuscular re-education;Therapeutic activities    PT Goals (Current goals can be found in the Care Plan section)       Frequency Min 1X/week     Co-evaluation               AM-PAC PT "6 Clicks" Mobility  Outcome Measure Help needed turning from your back to your side while in a flat bed without using bedrails?: None Help needed moving from lying on your back to sitting on the side of a flat bed without using bedrails?: None Help needed moving to and from a bed to a chair (including a wheelchair)?: A Little Help needed standing up from a chair using your arms (e.g., wheelchair or bedside chair)?: A Little ("a little" selected due to reports of dizziness with  standing) Help needed to walk in hospital room?: A Little Help needed climbing 3-5 steps with a railing? : A Little 6 Click Score: 20    End of Session Equipment Utilized During Treatment: Gait belt Activity Tolerance: Patient tolerated treatment well;Other (comment) (mild unsteadines with gait) Patient left: in bed;with call bell/phone within reach;with nursing/sitter in room Nurse Communication: Mobility status PT Visit Diagnosis: Unsteadiness on feet (R26.81);Difficulty in walking, not elsewhere classified (R26.2);Muscle weakness (generalized) (M62.81)    Time: 2440-1027 PT Time Calculation (min) (ACUTE ONLY): 17 min   Charges:   PT Evaluation $PT Eval Low Complexity: 1 Low PT Treatments $Gait Training: 8-22 mins        Ricard Dillon PT, DPT   Zollie Pee 08/26/2021, 9:26 AM

## 2021-08-26 NOTE — Plan of Care (Signed)
Neurology plan of care  Per discussion with hospitalist yesterday evening, patient had already returned to mental status baseline. Seizure was high on the differential last admission, and given that he had another spell I think it would be reasonable to start him on AED. Given his schizophrenia I will start depakote DR sprinkle 500mg  q 12 hrs and refer to outpatient neuro to establish care. He should have depakote level checked in 1-2 wks (can be with PCP if he has not seen neuro yet). Baseline LFTs and ammonia are WNL.  OK to discharge after first dose of VPA.  , MD [facility] [phone]  If 7pm- 7am, please page neurology on call as listed in [facility].

## 2021-08-26 NOTE — Discharge Summary (Signed)
Physician Discharge Summary   Tyrone Little  male DOB: Sep 06, 1960  [id number]  PCP: Bo Merino, FNP  Admit date: 08/25/2021 Discharge date: 08/26/2021  Admitted From: group home Disposition:  group home Brother (guardian) and group home owner updated about discharge plans prior to discharge.  CODE STATUS: Full code  Discharge Instructions     Ambulatory referral to Neurology   Complete by: As directed    Discharge instructions   Complete by: As directed    You have been started on Depakote sprinkle for empiric tx of possible seizure.  Please check Depakote leve 1-2 weeks after discharge, either with PCP or outpatient neurology.  Outpatient neurology referral has been made.  [facility] Course:  For full details, please see H&P, progress notes, consult notes and ancillary notes.  Briefly,  Tyrone Little is a 61 y.o. male with medical history significant of schizofaffective d/o, developmental delay, resident of a group home who presents with dysarthria and confusion.   He presented to ed as code stroke.  Patient in ed was much improved and was able to give some history but had garbled speech. it appears patient has the habit of doing this intermittently. When asked to speak clearly by his attendant he does so.   AMS Encephalopathy ruled out --MRI brain neg for stroke. --Pt had similar presentation for his recent 5/3-5/8 hospitalization.  At that time, seizure could not be ruled in or ruled out.   --Neuro consulted.  Seizure was high on the differential last admission, and given that pt had another spell, neuro started pt on AED with depakote DR sprinkle 500mg  q 12 hrs and refer to outpatient neuro to establish care.  --pt should have depakote level checked in 1-2 wks (can be with PCP if he has not seen neuro yet). This was communicated with the group home owner.  Schizofaffective d/o --cont home PROLIXIN, seroquel, and RISPERDAL --of note, metabolites of seroquel  turns UDS pos for Tricyclic.    BPH -cont flomax    Discharge Diagnoses:  Principal Problem:   Encephalopathy acute   30 Day Unplanned Readmission Risk Score    Flowsheet Row ED from 08/25/2021 in [place]  30 Day Unplanned Readmission Risk Score (%) 12.17 Filed at 08/26/2021 0801       This score is the patient's risk of an unplanned readmission within 30 days of being discharged (0 -100%). The score is based on dignosis, age, lab data, medications, orders, and past utilization.   Low:  0-14.9   Medium: 15-21.9   High: 22-29.9   Extreme: 30 and above         Discharge Instructions:  Allergies as of 08/26/2021   No Known Allergies      Medication List     STOP taking these medications    amoxicillin-clavulanate 875-125 MG tablet Commonly known as: AUGMENTIN   hydrOXYzine 50 MG tablet Commonly known as: ATARAX       TAKE these medications    acetaminophen 500 MG tablet Commonly known as: TYLENOL Take 2 tablets (1,000 mg total) by mouth every 8 (eight) hours as needed for fever (pain).   cetirizine 10 MG tablet Commonly known as: ZYRTEC Take 1 tablet (10 mg total) by mouth daily.   divalproex 125 MG capsule Commonly known as: DEPAKOTE SPRINKLE Take 4 capsules (500 mg total) by mouth every 12 (twelve) hours.   fluPHENAZine 5 MG tablet Commonly known as:  PROLIXIN Take 5 mg by mouth at bedtime.   ibuprofen 600 MG tablet Commonly known as: ADVIL Take 1 tablet (600 mg total) by mouth every 6 (six) hours as needed (pain).   linaclotide 145 MCG Caps capsule Commonly known as: LINZESS Take 1 capsule (145 mcg total) by mouth daily before breakfast.   lubiprostone 24 MCG capsule Commonly known as: AMITIZA Take 1 capsule (24 mcg total) by mouth 2 (two) times daily with a meal.   Omega-3 1000 MG Caps Take 1 capsule (1,000 mg total) by mouth daily.   OVER THE COUNTER MEDICATION Take 1 Scoop by mouth daily.  Best Fiber Powder   pioglitazone 15 MG tablet Commonly known as: ACTOS Take 1 tablet (15 mg total) by mouth daily.   QUEtiapine 200 MG tablet Commonly known as: SEROQUEL Take 200 mg by mouth at bedtime.   risperiDONE 2 MG tablet Commonly known as: RISPERDAL Take 2 mg by mouth 2 (two) times daily.   simvastatin 40 MG tablet Commonly known as: ZOCOR Take 1 tablet (40 mg total) by mouth at bedtime.   tamsulosin 0.4 MG Caps capsule Commonly known as: FLOMAX Take 1 capsule (0.4 mg total) by mouth daily.   Therems Tabs Take 1 tablet by mouth daily.   Vitamin D3 125 MCG (5000 UT) Caps TAKE 1 CAPSULE BY MOUTH ONCE DAILY  *TAKE WITH FOOD* What changed: See the new instructions.         Follow-up Information     Bo Merino, FNP Follow up in 1 week(s).   Specialty: Nurse Practitioner Why: please check depakote level. Contact information: [address] [place] [address] [phone]                 No Known Allergies   The results of significant diagnostics from this hospitalization (including imaging, microbiology, ancillary and laboratory) are listed below for reference.   Consultations:   Procedures/Studies: DG Chest 2 View  Result Date: 08/04/2021 CLINICAL DATA:  Respiratory failure.  Seizures. EXAM: CHEST - 2 VIEW COMPARISON:  08/02/2021 FINDINGS: Endotracheal tube and nasogastric tube have been removed since prior study. Low lung volumes are again seen with bibasilar atelectasis. No evidence of pulmonary consolidation or pleural effusion. IMPRESSION: No significant change in low lung volumes and bibasilar atelectasis. Electronically Signed   By: Marlaine Hind M.D.   On: 08/04/2021 12:10   DG Abdomen 1 View  Result Date: 08/01/2021 CLINICAL DATA:  OG tube placement. EXAM: ABDOMEN - 1 VIEW COMPARISON:  None Available. FINDINGS: An enteric tube is looped in the stomach with tip overlying the distal aspect of the stomach. Excreted  contrast material is noted in the renal collecting systems. No dilated loops of bowel are seen in the included portion of the abdomen to suggest obstruction. Multiple monitoring leads overlie the patient. No acute osseous abnormality is seen. IMPRESSION: Enteric tube in the stomach. Electronically Signed   By: Logan Bores M.D.   On: 08/01/2021 12:09   MR BRAIN W WO CONTRAST  Result Date: 08/25/2021 CLINICAL DATA:  Seizure, new-onset, no history of trauma EXAM: MRI HEAD WITHOUT AND WITH CONTRAST TECHNIQUE: Multiplanar, multiecho pulse sequences of the brain and surrounding structures were obtained without and with intravenous contrast. CONTRAST:  10mL GADAVIST GADOBUTROL 1 MMOL/ML IV SOLN COMPARISON:  CT head from the same day. FINDINGS: Brain: No acute infarction, hemorrhage, hydrocephalus, extra-axial collection or mass lesion. Question mildly asymmetrically small left hippocampus, which could represent mesial temporal sclerosis in the  correct clinical setting. No pathologic enhancement. Vascular: Major arterial flow voids are maintained at the skull base. Skull and upper cervical spine: Normal marrow signal. Sinuses/Orbits: No mastoid Other: No mastoid effusions. IMPRESSION: 1. Question mildly asymmetrically small left hippocampus, which could represent mesial temporal sclerosis in the correct clinical setting. Consider correlation with EEG findings. 2. Otherwise, no evidence of acute intracranial abnormality. Electronically Signed   By: Margaretha Sheffield M.D.   On: 08/25/2021 19:24   DG Chest Portable 1 View  Result Date: 08/25/2021 CLINICAL DATA:  Weakness, aphasia EXAM: PORTABLE CHEST 1 VIEW COMPARISON:  08/04/2021 FINDINGS: Single frontal view of the chest demonstrates an unremarkable cardiac silhouette. Low lung volumes with crowding of the central vasculature. No airspace disease, effusion, or pneumothorax. No acute bony abnormalities. IMPRESSION: 1. Low lung volumes.  No acute airspace disease.  Electronically Signed   By: Randa Ngo M.D.   On: 08/25/2021 15:51   DG Chest Port 1 View  Result Date: 08/02/2021 CLINICAL DATA:  Respiratory failure. EXAM: PORTABLE CHEST 1 VIEW COMPARISON:  Aug 01, 2021. FINDINGS: Stable cardiomediastinal silhouette. Endotracheal and nasogastric tubes are in grossly good position. Hypoinflation of the lungs are noted with mild bibasilar subsegmental atelectasis. Bony thorax is unremarkable. IMPRESSION: Hypoinflation of the lungs with mild bibasilar subsegmental atelectasis. Electronically Signed   By: Marijo Conception M.D.   On: 08/02/2021 07:58   DG Chest Portable 1 View  Result Date: 08/01/2021 CLINICAL DATA:  Intubation. EXAM: PORTABLE CHEST 1 VIEW COMPARISON:  Chest radiographs 08/01/2021 at 10:50 a.m. FINDINGS: An endotracheal tube has been placed and terminates 4 cm above the carina. The cardiomediastinal silhouette is unchanged. Lung volumes remain low with interval increased opacity in the left greater than right lung bases. No sizable pleural effusion or pneumothorax is identified. IMPRESSION: 1. Endotracheal tube as above. 2. Low lung volumes with increased bibasilar opacities, likely atelectasis. Electronically Signed   By: Logan Bores M.D.   On: 08/01/2021 12:08   DG Chest Port 1 View  Result Date: 08/01/2021 CLINICAL DATA:  SOB EXAM: PORTABLE CHEST 1 VIEW COMPARISON:  Chest x-ray September 22, 2020. FINDINGS: Low lung volumes. Mild streaky bibasilar opacities. No visible pleural effusions or pneumothorax. Cardiomediastinal silhouette is accentuated by AP technique and low lung volumes. IMPRESSION: Low lung volumes with mild streaky bibasilar opacities, favor atelectasis over infection. Dedicated PA and lateral radiographs could further characterize if clinically warranted. Electronically Signed   By: Margaretha Sheffield M.D.   On: 08/01/2021 11:13   EEG adult  Result Date: 08/01/2021 Lora Havens, MD     08/02/2021  9:45 AM Patient Name: Reynald Winkfield MRN:  [id number] Epilepsy Attending: Lora Havens Referring Physician/Provider: Amie Portland, MD Date: 08/01/2021 Duration: 22.32 mins Patient history: 61 year old with schizophrenia, developmental delay, resident of a group home brought in with what looks like a witnessed seizure.  EEG to evaluate for seizure. Level of alertness: Lethargic/sedated AEDs during EEG study: Versed Technical aspects: This EEG study was done with scalp electrodes positioned according to the 10-20 International system of electrode placement. Electrical activity was acquired at a sampling rate of 500Hz  and reviewed with a high frequency filter of 70Hz  and a low frequency filter of 1Hz . EEG data were recorded continuously and digitally stored. Description: EEG showed continuous generalized 3 to 5 Hz theta-delta slowing admixed with an excessive amount of 15 to 18 Hz beta activity  distributed symmetrically and diffusely. Hyperventilation and photic stimulation were not performed.   ABNORMALITY - Continuous  slow, generalized - Excessive beta, generalized IMPRESSION: This study is suggestive of moderate to severe diffuse encephalopathy, nonspecific etiology but likely related to sedation. No seizures or epileptiform discharges were seen throughout the recording. Priyanka Barbra Sarks   Overnight EEG with video  Result Date: 08/02/2021 Lora Havens, MD     08/03/2021 10:37 AM Patient Name: Quintavius Cary MRN: [id number] Epilepsy Attending: Lora Havens Referring Physician/Provider: Kerney Elbe, MD Duration: 08/01/2021 2226 to 08/02/2021 2226  Patient history: 61 year old with schizophrenia, developmental delay, resident of a group home brought in with what looks like a witnessed seizure.  EEG to evaluate for seizure.  Level of alertness: Lethargic/sedated  AEDs during EEG study: Versed, LEV  Technical aspects: This EEG study was done with scalp electrodes positioned according to the 10-20 International system of electrode placement. Electrical  activity was acquired at a sampling rate of 500Hz  and reviewed with a high frequency filter of 70Hz  and a low frequency filter of 1Hz . EEG data were recorded continuously and digitally stored.  Description: EEG showed continuous generalized 3 to 5 Hz theta-delta slowing admixed with an excessive amount of 15 to 18 Hz beta activity  distributed symmetrically and diffusely. Hyperventilation and photic stimulation were not performed.    ABNORMALITY - Continuous slow, generalized - Excessive beta, generalized  IMPRESSION: This study is suggestive of moderate to severe diffuse encephalopathy, nonspecific etiology but likely related to sedation. No seizures or epileptiform discharges were seen throughout the recording.  Lora Havens   CT HEAD CODE STROKE WO CONTRAST  Result Date: 08/25/2021 CLINICAL DATA:  Code stroke.  Neuro deficit, acute, stroke suspected EXAM: CT HEAD WITHOUT CONTRAST TECHNIQUE: Contiguous axial images were obtained from the base of the skull through the vertex without intravenous contrast. RADIATION DOSE REDUCTION: This exam was performed according to the departmental dose-optimization program which includes automated exposure control, adjustment of the mA and/or kV according to patient size and/or use of iterative reconstruction technique. COMPARISON:  CT head May 3rd, 2023. FINDINGS: Brain: No evidence of acute infarction, hemorrhage, hydrocephalus, extra-axial collection or mass lesion/mass effect. Partially empty sella. Vascular: No hyperdense vessel identified. Skull: No acute fracture. Sinuses/Orbits: Clear sinuses.  No acute orbital findings. Other: No mastoid effusions. ASPECTS [facility] Stroke Program Early CT Score) total score (0-10 with 10 being normal): 10. IMPRESSION: 1. No evidence of acute intracranial abnormality.ASPECTS is 10. 2. Partially empty sella, which is often a normal anatomic variant but can be associated with idiopathic intracranial hypertension (pseudotumor  cerebri). Code stroke imaging results were communicated on 08/25/2021 at 2:06 pm to provider [facility] via secure text paging. Electronically Signed   By: Margaretha Sheffield M.D.   On: 08/25/2021 14:09   CT HEAD CODE STROKE WO CONTRAST  Result Date: 08/01/2021 CLINICAL DATA:  Code stroke. EXAM: CT HEAD WITHOUT CONTRAST TECHNIQUE: Contiguous axial images were obtained from the base of the skull through the vertex without intravenous contrast. RADIATION DOSE REDUCTION: This exam was performed according to the departmental dose-optimization program which includes automated exposure control, adjustment of the mA and/or kV according to patient size and/or use of iterative reconstruction technique. COMPARISON:  None Available. FINDINGS: Brain: There is no acute intracranial hemorrhage, extra-axial fluid collection, or acute infarct. Parenchymal volume is normal. The ventricles are normal in size. Gray-white differentiation is preserved. There is no mass lesion. There is no mass effect or midline shift. Vascular: There is calcification of the bilateral cavernous ICAs. No dense vessel is seen. Skull: Normal. Negative for fracture  or focal lesion. Sinuses/Orbits: The imaged paranasal sinuses are clear. The globes and orbits are unremarkable. Other: None. ASPECTS [facility] Stroke Program Early CT Score) - Ganglionic level infarction (caudate, lentiform nuclei, internal capsule, insula, M1-M3 cortex): 7 - Supraganglionic infarction (M4-M6 cortex): 3 Total score (0-10 with 10 being normal): 10 IMPRESSION: 1. No acute intracranial pathology. 2. ASPECTS is 10 These results were called by telephone at the time of interpretation on 08/01/2021 at 9:09 am to provider [facility] , who verbally acknowledged these results. Electronically Signed   By: Valetta Mole M.D.   On: 08/01/2021 09:13   CT ANGIO HEAD NECK W WO CM (CODE STROKE)  Result Date: 08/25/2021 CLINICAL DATA:  Neuro deficit, acute, stroke suspected EXAM: CT ANGIOGRAPHY HEAD  AND NECK TECHNIQUE: Multidetector CT imaging of the head and neck was performed using the standard protocol during bolus administration of intravenous contrast. Multiplanar CT image reconstructions and MIPs were obtained to evaluate the vascular anatomy. Carotid stenosis measurements (when applicable) are obtained utilizing NASCET criteria, using the distal internal carotid diameter as the denominator. RADIATION DOSE REDUCTION: This exam was performed according to the departmental dose-optimization program which includes automated exposure control, adjustment of the mA and/or kV according to patient size and/or use of iterative reconstruction technique. CONTRAST:  75 mL of Omnipaque 350 IV. COMPARISON:  CTA Aug 01, 2021. FINDINGS: CTA NECK FINDINGS Aortic arch: Great vessel origins are patent without significant stenosis Right carotid system: Atherosclerosis at the carotid bifurcation without greater than 50% stenosis. Tortuosity of the ICA at the skull base. Left carotid system: Atherosclerosis at the carotid bifurcation without greater than 50% stenosis. Tortuosity of the ICA at the skull base. Vertebral arteries: Slightly left dominant. Bilateral vertebral arteries are patent without significant (greater than 50%) stenosis. Skeleton: Moderate to severe lower cervical degenerative disc disease. Other neck: Subcentimeter thyroid nodules, which do not require further imaging follow-up. (Ref: J Am Coll Radiol. 2015 Feb;12(2): 143-50). Upper chest: Visualized lung apices are clear.  Knee Review of the MIP images confirms the above findings CTA HEAD FINDINGS Anterior circulation: Calcific atherosclerosis of bilateral intracranial ICAs without significant stenosis. Bilateral MCAs and ACAs are patent without proximal hemodynamically significant stenosis. Posterior circulation: Atherosclerosis of bilateral vertebral arteries at their dural margin. Both intradural vertebral arteries, basilar artery and bilateral posterior  cerebral arteries are patent without proximal hemodynamically significant stenosis. Venous sinuses: As permitted by contrast timing, patent. Review of the MIP images confirms the above findings IMPRESSION: No emergent large vessel occlusion or proximal hemodynamically significant stenosis in the head or neck. Electronically Signed   By: Margaretha Sheffield M.D.   On: 08/25/2021 14:15   CT ANGIO HEAD NECK W WO CM W PERF (CODE STROKE)  Result Date: 08/01/2021 CLINICAL DATA:  Stroke code EXAM: CT ANGIOGRAPHY HEAD AND NECK CT PERFUSION BRAIN TECHNIQUE: Multidetector CT imaging of the head and neck was performed using the standard protocol during bolus administration of intravenous contrast. Multiplanar CT image reconstructions and MIPs were obtained to evaluate the vascular anatomy. Carotid stenosis measurements (when applicable) are obtained utilizing NASCET criteria, using the distal internal carotid diameter as the denominator. Multiphase CT imaging of the brain was performed following IV bolus contrast injection. Subsequent parametric perfusion maps were calculated using RAPID software. RADIATION DOSE REDUCTION: This exam was performed according to the departmental dose-optimization program which includes automated exposure control, adjustment of the mA and/or kV according to patient size and/or use of iterative reconstruction technique. CONTRAST:  141mL OMNIPAQUE IOHEXOL 350  MG/ML SOLN COMPARISON:  Same-day noncontrast CT head FINDINGS: CTA NECK FINDINGS Aortic arch: The imaged aortic arch is normal. The origins of the major branch vessels are patent. The subclavian arteries are patent to the level imaged. Right carotid system: The right common, internal, and external carotid arteries are patent with mild plaque at the bifurcation but no hemodynamically significant stenosis or occlusion. There is no dissection or aneurysm. The high cervical ICA is tortuous. Left carotid system: The left common carotid artery is  patent. There is mild calcified plaque at the bifurcation resulting in less than 50% stenosis. The distal left internal carotid artery is patent. The left external carotid artery is patent. There is no evidence of dissection or aneurysm. Vertebral arteries: The vertebral arteries are patent, without hemodynamically significant stenosis or occlusion. There is no dissection or aneurysm. Skeleton: There is mild degenerative change C5-C6 and C6-C7. There is no acute osseous abnormality or suspicious osseous lesion. There is no visible canal hematoma. Other neck: The soft tissues are unremarkable. Upper chest: The imaged lung apices are clear. Review of the MIP images confirms the above findings CTA HEAD FINDINGS Anterior circulation: There is mild calcified plaque in the intracranial ICAs without significant stenosis. The bilateral MCAs are patent The bilateral ACAs are patent. The anterior communicating artery is normal. There is no aneurysm or AVM. Posterior circulation: The bilateral V4 segments are patent with mild calcified plaque. PICA is identified bilaterally. The basilar artery is patent The bilateral PCAs are patent. The posterior communicating arteries are not identified. There is no aneurysm or AVM. Venous sinuses: Patent. Anatomic variants: None. Review of the MIP images confirms the above findings CT Brain Perfusion Findings: ASPECTS: 10 CBF (<30%) Volume: 64mL Perfusion (Tmax>6.0s) volume: 66mL Mismatch Volume: 6mL Infarction Location:N/a IMPRESSION: 1. No hemodynamically significant stenosis or occlusion in the head or neck. Mild calcified plaque at the carotid bifurcations resulting in less than 50% stenosis. 2. No infarct core or penumbra identified on CT perfusion. Electronically Signed   By: Valetta Mole M.D.   On: 08/01/2021 10:01      Labs: BNP (last 3 results) Recent Labs    08/25/21 1444  BNP [id number]   Basic Metabolic Panel: Recent Labs  Lab 08/25/21 1335  NA 139  K 3.7  CL 104  CO2  27  GLUCOSE 178*  BUN 16  CREATININE 0.78  CALCIUM 8.8*   Liver Function Tests: Recent Labs  Lab 08/25/21 1335  AST 15  ALT 14  ALKPHOS 66  BILITOT 0.8  PROT 6.8  ALBUMIN 3.8   Recent Labs  Lab 08/25/21 1444  LIPASE 24   Recent Labs  Lab 08/25/21 2015  AMMONIA <10   CBC: Recent Labs  Lab 08/25/21 1335  WBC 6.4  NEUTROABS 5.3  HGB 14.3  HCT 43.9  MCV 86.8  PLT 293   Cardiac Enzymes: No results for input(s): CKTOTAL, CKMB, CKMBINDEX, TROPONINI in the last 168 hours. BNP: Invalid input(s): POCBNP CBG: Recent Labs  Lab 08/25/21 1335  GLUCAP 168*   D-Dimer No results for input(s): DDIMER in the last 72 hours. Hgb A1c No results for input(s): HGBA1C in the last 72 hours. Lipid Profile No results for input(s): CHOL, HDL, LDLCALC, TRIG, CHOLHDL, LDLDIRECT in the last 72 hours. Thyroid function studies No results for input(s): TSH, T4TOTAL, T3FREE, THYROIDAB in the last 72 hours.  Invalid input(s): FREET3 Anemia work up No results for input(s): VITAMINB12, FOLATE, FERRITIN, TIBC, IRON, RETICCTPCT in the last 72 hours. Urinalysis  Component Value Date/Time   COLORURINE YELLOW (A) 08/25/2021 1908   APPEARANCEUR CLEAR (A) 08/25/2021 1908   LABSPEC 1.020 08/25/2021 1908   PHURINE 7.0 08/25/2021 1908   GLUCOSEU NEGATIVE 08/25/2021 1908   HGBUR NEGATIVE 08/25/2021 1908   BILIRUBINUR NEGATIVE 08/25/2021 1908   KETONESUR NEGATIVE 08/25/2021 1908   PROTEINUR NEGATIVE 08/25/2021 1908   NITRITE NEGATIVE 08/25/2021 1908   LEUKOCYTESUR NEGATIVE 08/25/2021 1908   Sepsis Labs Invalid input(s): PROCALCITONIN,  WBC,  LACTICIDVEN Microbiology Recent Results (from the past 240 hour(s))  Resp Panel by RT-PCR (Flu A&B, Covid) Anterior Nasal Swab     Status: None   Collection Time: 08/25/21  2:44 PM   Specimen: Anterior Nasal Swab  Result Value Ref Range Status   SARS Coronavirus 2 by RT PCR NEGATIVE NEGATIVE Final    Comment: (NOTE) SARS-CoV-2 target nucleic  acids are NOT DETECTED.  The SARS-CoV-2 RNA is generally detectable in upper respiratory specimens during the acute phase of infection. The lowest concentration of SARS-CoV-2 viral copies this assay can detect is 138 copies/mL. A negative result does not preclude SARS-Cov-2 infection and should not be used as the sole basis for treatment or other patient management decisions. A negative result may occur with  improper specimen collection/handling, submission of specimen other than nasopharyngeal swab, presence of viral mutation(s) within the areas targeted by this assay, and inadequate number of viral copies(<138 copies/mL). A negative result must be combined with clinical observations, patient history, and epidemiological information. The expected result is Negative.  Fact Sheet for Patients:  EntrepreneurPulse.com.au  Fact Sheet for Healthcare Providers:  IncredibleEmployment.be  This test is no t yet approved or cleared by the [place] FDA and  has been authorized for detection and/or diagnosis of SARS-CoV-2 by FDA under an Emergency Use Authorization (EUA). This EUA will remain  in effect (meaning this test can be used) for the duration of the COVID-19 declaration under Section 564(b)(1) of the Act, 21 U.S.C.section 360bbb-3(b)(1), unless the authorization is terminated  or revoked sooner.       Influenza A by PCR NEGATIVE NEGATIVE Final   Influenza B by PCR NEGATIVE NEGATIVE Final    Comment: (NOTE) The Xpert Xpress SARS-CoV-2/FLU/RSV plus assay is intended as an aid in the diagnosis of influenza from Nasopharyngeal swab specimens and should not be used as a sole basis for treatment. Nasal washings and aspirates are unacceptable for Xpert Xpress SARS-CoV-2/FLU/RSV testing.  Fact Sheet for Patients: EntrepreneurPulse.com.au  Fact Sheet for Healthcare Providers: IncredibleEmployment.be  This  test is not yet approved or cleared by the [place] FDA and has been authorized for detection and/or diagnosis of SARS-CoV-2 by FDA under an Emergency Use Authorization (EUA). This EUA will remain in effect (meaning this test can be used) for the duration of the COVID-19 declaration under Section 564(b)(1) of the Act, 21 U.S.C. section 360bbb-3(b)(1), unless the authorization is terminated or revoked.  Performed at [facility], [address] [place], [place] [address]      Total time spend on discharging this patient, including the last patient exam, discussing the hospital stay, instructions for ongoing care as it relates to all pertinent caregivers, as well as preparing the medical discharge records, prescriptions, and/or referrals as applicable, is 50 minutes.    Enzo Bi, MD  [facility] 08/26/2021, 10:40 AM

## 2021-08-26 NOTE — Progress Notes (Signed)
SLP Cancellation Note  Patient Details Name: Antuan Limes MRN: [id number] DOB: 02-28-1961   Cancelled treatment:       Reason Eval/Treat Not Completed: SLP screened, no needs identified, will sign off   SLP consult received and appreciated. Chart review completed. Per Neurology note this AM "Per discussion with hospitalist yesterday evening, patient had already returned to mental status baseline." Pt with hx of developmental delay and resides in group home. Pt with TDD of this date.   Clyde Canterbury, M.S., CCC-SLP Speech-Language Pathologist [facility] [phone] Arnette Felts)  Woodroe Chen 08/26/2021, 11:24 AM

## 2021-08-26 NOTE — TOC Progression Note (Signed)
Transition of Care [facility]) - Progression Note    Patient Details  Name: Tyrone Little MRN: [id number] Date of Birth: 1960-04-19  Transition of Care [facility]) CM/SW Contact  Merrily Brittle, [place] Phone Number: 08/26/2021, 9:34 AM  Clinical Narrative:     Provider informed CSW that patient is medically cleared to discharge. CSW reached out to group home owner Clarissa P. [phone]) to confirm the patient could return. Clarissa requested more information about patient's medical status and CSW connected her to provider.        Expected Discharge Plan and Services                                                 Social Determinants of Health (SDOH) Interventions    Readmission Risk Interventions     View : No data to display.

## 2021-08-28 ENCOUNTER — Other Ambulatory Visit: Payer: Self-pay | Admitting: Nurse Practitioner

## 2021-08-28 DIAGNOSIS — R159 Full incontinence of feces: Secondary | ICD-10-CM

## 2021-08-28 NOTE — Telephone Encounter (Signed)
Called to speak [facility] care home regarding supplies. Phone is not working

## 2021-08-29 ENCOUNTER — Other Ambulatory Visit: Payer: Self-pay | Admitting: Nurse Practitioner

## 2021-09-04 ENCOUNTER — Telehealth: Payer: Self-pay | Admitting: Nurse Practitioner

## 2021-09-04 NOTE — Telephone Encounter (Unsigned)
Copied from [place]. Topic: General - Other >> Sep 04, 2021 11:54 AM Valere Dross wrote: Reason for CRM: Merleen Nicely a supply rep called in wondering if we had receieved the order she sent on 05/30, for the adults diapers, which looks like we did. She was wanting to let PCP know that on bother forms they are needing cause of diagnose, the reason behind why the pt is Incontinence to using the bathroom., her call back is [phone] ext: [phone], and can leave a vm if needed. Please advise.

## 2021-09-04 NOTE — Telephone Encounter (Signed)
Tyrone Little w/ [facility] calling to ask for a new FL2 for the pt.  Tyrone Little asked the [facility] be sent through email. I advised Tyrone Little we cannot use email for this. I called the office and spoke w/ Tyrone Little who confirmed we do not email.  Tyrone Little said all her other dr offices let her email the [facility] I asked her to print and bring by the office. She said she was getting upset wit me and hung up the phone.

## 2021-09-04 NOTE — Telephone Encounter (Signed)
Spoke to [place] at group home she stated patient needed the pullups. She stated he gets so constipated that he cannot stop the Linzess and Miralax. Paperwork signed and faxed

## 2021-09-07 ENCOUNTER — Inpatient Hospital Stay: Payer: Medicare Other | Admitting: Family Medicine

## 2021-09-08 NOTE — Progress Notes (Signed)
BNP was ordered because I saw the chest x-ray and was not sure if the increase in markings was due to CHF or just the poor inspiratory film.  The BNP being negative help me decide that it was only the poor inspiratory film but was going on.

## 2021-09-12 ENCOUNTER — Telehealth: Payer: Self-pay | Admitting: Nurse Practitioner

## 2021-09-12 NOTE — Telephone Encounter (Signed)
Merleen Nicely calling with [employer] called and stated that forms for incontinence will need to be corrected. Form [employer]- The provider wrote an invalid diagnoses. For form [facility] also needs diagnoses and 19 and 20 need to be answered. Form needs to be signed and dated.   Please advise and call kelsey with any questions   Cb#[phone]

## 2021-09-18 ENCOUNTER — Encounter: Payer: Self-pay | Admitting: *Deleted

## 2021-09-18 ENCOUNTER — Emergency Department
Admission: EM | Admit: 2021-09-18 | Discharge: 2021-09-18 | Disposition: A | Discharge status: Home / Self Care | Payer: Medicare Other | Attending: Emergency Medicine | Admitting: Emergency Medicine

## 2021-09-18 ENCOUNTER — Other Ambulatory Visit: Payer: Self-pay

## 2021-09-18 ENCOUNTER — Emergency Department: Payer: Medicare Other

## 2021-09-18 DIAGNOSIS — S00531A Contusion of lip, initial encounter: Secondary | ICD-10-CM | POA: Insufficient documentation

## 2021-09-18 DIAGNOSIS — S0990XA Unspecified injury of head, initial encounter: Secondary | ICD-10-CM | POA: Diagnosis present

## 2021-09-18 DIAGNOSIS — R519 Headache, unspecified: Secondary | ICD-10-CM | POA: Insufficient documentation

## 2021-09-18 DIAGNOSIS — S139XXA Sprain of joints and ligaments of unspecified parts of neck, initial encounter: Secondary | ICD-10-CM | POA: Insufficient documentation

## 2021-09-18 DIAGNOSIS — Y92811 Bus as the place of occurrence of the external cause: Secondary | ICD-10-CM | POA: Insufficient documentation

## 2021-09-18 DIAGNOSIS — S199XXA Unspecified injury of neck, initial encounter: Secondary | ICD-10-CM | POA: Diagnosis present

## 2021-09-18 NOTE — Discharge Instructions (Signed)
Follow-up with your regular doctor as needed.  Return if worsening.  Take Tylenol or ibuprofen for pain as needed
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
## 2021-09-18 NOTE — ED Notes (Signed)
Pt caregiver, [phone]  states she will be able to pick patient up when her is ready for discharge.

## 2021-09-18 NOTE — ED Notes (Signed)
Pt seen and evaluated in triage by provider
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
## 2021-09-19 ENCOUNTER — Other Ambulatory Visit: Payer: Self-pay

## 2021-09-19 ENCOUNTER — Ambulatory Visit (INDEPENDENT_AMBULATORY_CARE_PROVIDER_SITE_OTHER): Payer: Medicare Other | Admitting: Family Medicine

## 2021-09-19 ENCOUNTER — Encounter: Payer: Self-pay | Admitting: Family Medicine

## 2021-09-19 VITALS — BP 124/82 | HR 75 | Temp 97.8°F | Resp 16 | Ht 71.25 in | Wt 234.3 lb

## 2021-09-19 DIAGNOSIS — T148XXA Other injury of unspecified body region, initial encounter: Secondary | ICD-10-CM

## 2021-09-19 DIAGNOSIS — R300 Dysuria: Secondary | ICD-10-CM | POA: Diagnosis not present

## 2021-09-19 DIAGNOSIS — Y9210 Unspecified residential institution as the place of occurrence of the external cause: Secondary | ICD-10-CM | POA: Diagnosis not present

## 2021-09-19 LAB — POCT URINALYSIS DIPSTICK
Bilirubin, UA: NEGATIVE
Glucose, UA: NEGATIVE
Ketones, UA: NEGATIVE
Leukocytes, UA: NEGATIVE
Nitrite, UA: NEGATIVE
Protein, UA: NEGATIVE
Spec Grav, UA: 1.02 (ref 1.010–1.025)
Urobilinogen, UA: 4 E.U./dL — AB
pH, UA: 5 (ref 5.0–8.0)
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
## 2021-09-21 ENCOUNTER — Inpatient Hospital Stay: Payer: Medicare Other | Admitting: Nurse Practitioner

## 2021-09-24 ENCOUNTER — Telehealth: Payer: Self-pay | Admitting: Nurse Practitioner

## 2021-09-25 ENCOUNTER — Other Ambulatory Visit: Payer: Self-pay

## 2021-09-25 DIAGNOSIS — R159 Full incontinence of feces: Secondary | ICD-10-CM

## 2021-09-25 DIAGNOSIS — R300 Dysuria: Secondary | ICD-10-CM

## 2021-09-25 MED ORDER — COMFORT SHIELD ADULT DIAPERS MISC: 60.0000 | Freq: Two times a day (BID) | 12 refills | Status: AC

## 2021-09-27 NOTE — Telephone Encounter (Signed)
Refaxed this am, julie states if this does not cover he will need to get orders from GI

## 2021-09-27 NOTE — Telephone Encounter (Signed)
Danford Bad called to report that [facility] is missing the diagnosis of why the patient needs the supplies, she says they need this asap because he is completely out.

## 2021-11-27 ENCOUNTER — Telehealth: Payer: Self-pay | Admitting: Nurse Practitioner

## 2021-11-28 NOTE — Telephone Encounter (Signed)
Requested medication (s) are due for refill today:no  Requested medication (s) are on the active medication list: yes Last refill:  08/13/21, 90 and 1  Future visit scheduled:yes  Notes to clinic:  Unable to refill per protocol, last refill by provider 08/13/21 for 90 and 1 RF. Request is too soon, routing for review.     Requested Prescriptions  Pending Prescriptions Disp Refills   lubiprostone (AMITIZA) 24 MCG capsule [Pharmacy Med Name: Lubiprostone 24 MCG Capsule] 56 capsule 10    Sig: TAKE 1 CAPSULE BY MOUTH TWICE DAILY WITH MEALS     Gastroenterology: Irritable Bowel Syndrome - lubiprostone Passed - 11/27/2021  3:25 PM      Passed - AST in normal range and within 360 days    AST  Date Value Ref Range Status  08/25/2021 15 15 - 41 U/L Final         Passed - ALT in normal range and within 360 days    ALT  Date Value Ref Range Status  08/25/2021 14 0 - 44 U/L Final         Passed - Valid encounter within last 12 months    Recent Outpatient Visits           2 months ago Dysuria   [facility] Caro Laroche, DO   3 months ago Hospital discharge follow-up   [facility] Berniece Salines, FNP   6 months ago Schizoaffective disorder, unspecified type [facility])   [facility] Berniece Salines, FNP       Future Appointments             In 3 months Zane Herald, Rudolpho Sevin, FNP [facility], [facility]

## 2021-11-29 ENCOUNTER — Other Ambulatory Visit: Payer: Self-pay | Admitting: Emergency Medicine

## 2021-11-29 MED ORDER — LUBIPROSTONE 24 MCG PO CAPS: 24.0000 ug | ORAL_CAPSULE | Freq: Two times a day (BID) | ORAL | 1 refills | Status: DC

## 2021-12-04 ENCOUNTER — Other Ambulatory Visit: Payer: Self-pay | Admitting: Nurse Practitioner

## 2021-12-04 NOTE — Telephone Encounter (Addendum)
Medication Refill - Medication: divalproex (DEPAKOTE SPRINKLE) 125 MG capsule  Has the patient contacted their pharmacy? Yes.   (Agent: If no, request that the patient contact the pharmacy for the refill. If patient does not wish to contact the pharmacy document the reason why and proceed with request.) (Agent: If yes, when and what did the pharmacy advise?)call dr   Preferred Pharmacy (with phone number or street name):  [facility] [place], [place] - [address] [place] Phone:  [phone]  Fax:  [phone]     Has the patient been seen for an appointment in the last year OR does the patient have an upcoming appointment? Yes.    Agent: Please be advised that RX refills may take up to 3 business days. We ask that you follow-up with your pharmacy.

## 2021-12-06 NOTE — Telephone Encounter (Signed)
Requested medication (s) are due for refill today: yes  Requested medication (s) are on the active medication list: yes  Last refill:  5/282/23 #240 with 2 RF  Future visit scheduled: 03/20/22, seen 09/19/21  Notes to clinic:  Failed protocol of labs within 12 months, Valportic Acid lab out of date, has upcoming appt, please assess.       Requested Prescriptions  Pending Prescriptions Disp Refills   divalproex (DEPAKOTE SPRINKLE) 125 MG capsule 240 capsule 2    Sig: Take 4 capsules (500 mg total) by mouth every 12 (twelve) hours.     Neurology:  Anticonvulsants - Valproates Failed - 12/04/2021  5:30 PM      Failed - Valproic Acid (serum) in normal range and within 360 days    No results found for: "VALPROATE", "VPAT"       Passed - AST in normal range and within 360 days    AST  Date Value Ref Range Status  08/25/2021 15 15 - 41 U/L Final         Passed - ALT in normal range and within 360 days    ALT  Date Value Ref Range Status  08/25/2021 14 0 - 44 U/L Final         Passed - HGB in normal range and within 360 days    Hemoglobin  Date Value Ref Range Status  08/25/2021 14.3 13.0 - 17.0 g/dL Final         Passed - PLT in normal range and within 360 days    Platelets  Date Value Ref Range Status  08/25/2021 293 150 - 400 K/uL Final         Passed - WBC in normal range and within 360 days    WBC  Date Value Ref Range Status  08/25/2021 6.4 4.0 - 10.5 K/uL Final         Passed - HCT in normal range and within 360 days    HCT  Date Value Ref Range Status  08/25/2021 43.9 39.0 - 52.0 % Final         Passed - Completed PHQ-2 or PHQ-9 in the last 360 days      Passed - Patient is not pregnant      Passed - Valid encounter within last 12 months    Recent Outpatient Visits           2 months ago Dysuria   [facility] Caro Laroche, DO   4 months ago Hospital discharge follow-up   [facility] Berniece Salines, FNP    7 months ago Schizoaffective disorder, unspecified type [facility])   [facility] Berniece Salines, FNP       Future Appointments             In 3 months Zane Herald, Rudolpho Sevin, FNP [facility], [facility]

## 2021-12-06 NOTE — Telephone Encounter (Signed)
[facility] called regarding Tyrone Little's refill on Depakote.  They would like hte status.

## 2021-12-07 ENCOUNTER — Other Ambulatory Visit: Payer: Self-pay | Admitting: Emergency Medicine

## 2021-12-07 NOTE — Telephone Encounter (Unsigned)
Copied from CRM [id number]. Topic: General - Other >> Dec 07, 2021  9:22 AM Clide Dales wrote: Danford Bad called to let Myriam Jacobson know that she confirmed the dosage for patients divalproex (DEPAKOTE SPRINKLE) 125 MG capsule Sig - Route: Take 4 capsules (500 mg total) by mouth every 12 (twelve) hours. - Oral  and that the prescription can be sent in. Please follow up with Danford Bad if there are any questions.

## 2021-12-07 NOTE — Telephone Encounter (Signed)
Spoke to care home and explained to them patient must get his refills from Neurology

## 2022-03-19 ENCOUNTER — Other Ambulatory Visit: Payer: Self-pay | Admitting: Nurse Practitioner

## 2022-03-20 ENCOUNTER — Ambulatory Visit (INDEPENDENT_AMBULATORY_CARE_PROVIDER_SITE_OTHER): Payer: Medicare Other | Admitting: Nurse Practitioner

## 2022-03-20 ENCOUNTER — Other Ambulatory Visit: Payer: Self-pay

## 2022-03-20 ENCOUNTER — Encounter: Payer: Self-pay | Admitting: Nurse Practitioner

## 2022-03-20 ENCOUNTER — Other Ambulatory Visit: Payer: Self-pay | Admitting: Nurse Practitioner

## 2022-03-20 VITALS — BP 128/76 | HR 74 | Temp 98.1°F | Resp 16 | Ht 71.25 in | Wt 233.9 lb

## 2022-03-20 DIAGNOSIS — Z13220 Encounter for screening for lipoid disorders: Secondary | ICD-10-CM

## 2022-03-20 DIAGNOSIS — F259 Schizoaffective disorder, unspecified: Secondary | ICD-10-CM | POA: Diagnosis not present

## 2022-03-20 DIAGNOSIS — Z5181 Encounter for therapeutic drug level monitoring: Secondary | ICD-10-CM

## 2022-03-20 DIAGNOSIS — E559 Vitamin D deficiency, unspecified: Secondary | ICD-10-CM | POA: Diagnosis not present

## 2022-03-20 DIAGNOSIS — E785 Hyperlipidemia, unspecified: Secondary | ICD-10-CM

## 2022-03-20 DIAGNOSIS — Z23 Encounter for immunization: Secondary | ICD-10-CM | POA: Diagnosis not present

## 2022-03-20 DIAGNOSIS — Z130 Encounter for screening for diseases of the blood and blood-forming organs and certain disorders involving the immune mechanism: Secondary | ICD-10-CM

## 2022-03-20 DIAGNOSIS — R569 Unspecified convulsions: Secondary | ICD-10-CM | POA: Insufficient documentation

## 2022-03-20 DIAGNOSIS — K5909 Other constipation: Secondary | ICD-10-CM | POA: Insufficient documentation

## 2022-03-20 NOTE — Progress Notes (Signed)
BP 128/76   Pulse 74   Temp 98.1 F (36.7 C) (Oral)   Resp 16   Ht 5' 11.25" (1.81 m)   Wt 233 lb 14.4 oz (106.1 kg)   SpO2 98%   BMI 32.39 kg/m    Subjective:    Patient ID: Tyrone Little, male    DOB: 05-Sep-1960, 61 y.o.   MRN: [id number]  HPI: Tyrone Little is a 61 y.o. male  Chief Complaint  Patient presents with   Hyperlipidemia   schizoaffective disorder    Schizoaffective disorder: He is currently being treated at [facility] in [place].  He is currently on risperidone 2 mg BID, seroquel 200 mg at bed time, fluphenazine 5 mg at bedtime and hydroxyzine 50 mg TID PRN.      03/20/2022    1:27 PM 09/19/2021    1:05 PM 08/07/2021    3:27 PM 05/09/2021   11:26 AM 05/09/2021   10:54 AM  Depression screen PHQ 2/9  Decreased Interest 0 0 0 0 0  Down, Depressed, Hopeless 0 0 0 0 0  PHQ - 2 Score 0 0 0 0 0  Altered sleeping    0   Tired, decreased energy    0   Change in appetite    0   Feeling bad or failure about yourself     0   Trouble concentrating    0   Moving slowly or fidgety/restless    0   Suicidal thoughts    0   PHQ-9 Score    0   Difficult doing work/chores    Not difficult at all     Hyperlipidemia: his last LDL was 70 on 05/09/2021. He is currently taking simvastatin 40 mg at bedtime.  He denies any myalgia.     Vitamin D deficiency: his last vitamin d was 30 on 05/09/2021. He is currently taking vitamin d supplementation.   Chronic constipation: he does see GI,  last seen on 05/28/2021. He currently takes amitiza 24 mcg two times a day, miralax and stool softener.  Patient has also reached out to gi because patient is having stool incontinence.  He recommended that he do miralax 17 grams twice daily and trilyte prep. Patient is due for colonoscopy, recommend follow up with GI.   History of AMS/? Seizure: patient was seen at the er in May for altered mental status and they suspected he had a seizure, started him on Depakote.  Patient denies any recent  episodes. Patient was supposed to see neurology.  Referral was placed by ER. Patient reports he has been taking the depakote but has not had an appointment with neurology.  Contacting office to schedule appointment.  Will check level.  Patient is scheduled to see neurology on Apr 04 2022.   Relevant past medical, surgical, family and social history reviewed and updated as indicated. Interim medical history since our last visit reviewed. Allergies and medications reviewed and updated.  Review of Systems  Constitutional: Negative for fever or weight change.  Respiratory: Negative for cough and shortness of breath.   Cardiovascular: Negative for chest pain or palpitations.  Gastrointestinal: Negative for abdominal pain, no bowel changes.  Musculoskeletal: Negative for gait problem or joint swelling.  Skin: Negative for rash.  Neurological: Negative for dizziness or headache.  No other specific complaints in a complete review of systems (except as listed in HPI above).      Objective:    BP 128/76   Pulse 74  Temp 98.1 F (36.7 C) (Oral)   Resp 16   Ht 5' 11.25" (1.81 m)   Wt 233 lb 14.4 oz (106.1 kg)   SpO2 98%   BMI 32.39 kg/m   Wt Readings from Last 3 Encounters:  03/20/22 233 lb 14.4 oz (106.1 kg)  09/19/21 234 lb 4.8 oz (106.3 kg)  09/18/21 233 lb 11 oz (106 kg)    Physical Exam  Constitutional: Patient appears well-developed and well-nourished. Obese  No distress.  HEENT: head atraumatic, normocephalic, pupils equal and reactive to light, neck supple Cardiovascular: Normal rate, regular rhythm and normal heart sounds.  No murmur heard. No BLE edema. Pulmonary/Chest: Effort normal and breath sounds normal. No respiratory distress. Abdominal: Soft.  There is no tenderness. Neuro: equal grip, no arm drift Psychiatric: Patient has a normal mood and affect. behavior is normal. Judgment and thought content normal.  Results for orders placed or performed in visit on 09/19/21   POCT urinalysis dipstick  Result Value Ref Range   Color, UA gold    Clarity, UA clear    Glucose, UA Negative Negative   Bilirubin, UA negative    Ketones, UA negative    Spec Grav, UA 1.020 1.010 - 1.025   Blood, UA small    pH, UA 5.0 5.0 - 8.0   Protein, UA Negative Negative   Urobilinogen, UA 4.0 (A) 0.2 or 1.0 E.U./dL   Nitrite, UA negative    Leukocytes, UA Negative Negative   Appearance clear    Odor none       Assessment & Plan:   Problem List Items Addressed This Visit       Digestive   Chronic constipation with overflow incontinence    Follow up with gi, continue amitiza 24 mcg two times a day, miralax and stool softener.        Other   Hyperlipidemia    Continue taking simvastatin 40 mg daily, will check labs      Relevant Orders   Lipid panel   Schizoaffective disorder (HCC) - Primary    Continue risperidone 2 mg BID, seroquel 200 mg at bed time, fluphenazine 5 mg at bedtime and hydroxyzine 50 mg TID PRN.        Vitamin D deficiency    Continue vitamin d supplement      Relevant Orders   VITAMIN D 25 Hydroxy (Vit-D Deficiency, Fractures)   Seizure-like activity (HCC)    Continue depakote, follow up with neurology, appointment scheduled.       Relevant Orders   Valproic Acid level   Other Visit Diagnoses     Need for influenza vaccination       Relevant Orders   Flu Vaccine QUAD 6+ mos PF IM (Fluarix Quad PF) (Completed)   Screening for cholesterol level       Relevant Orders   COMPLETE METABOLIC PANEL WITH GFR   Lipid panel   Screening for deficiency anemia       Medication monitoring encounter       Relevant Orders   Valproic Acid level   CBC with Differential/Platelet   COMPLETE METABOLIC PANEL WITH GFR   Lipid panel   VITAMIN D 25 Hydroxy (Vit-D Deficiency, Fractures)        Follow up plan: Return in about 6 months (around 09/19/2022) for follow up.

## 2022-03-20 NOTE — Assessment & Plan Note (Signed)
Follow up with gi, continue amitiza 24 mcg two times a day, miralax and stool softener.

## 2022-03-20 NOTE — Assessment & Plan Note (Signed)
Continue taking simvastatin 40 mg daily, will check labs

## 2022-03-20 NOTE — Assessment & Plan Note (Signed)
Continue risperidone 2 mg BID, seroquel 200 mg at bed time, fluphenazine 5 mg at bedtime and hydroxyzine 50 mg TID PRN.

## 2022-03-20 NOTE — Assessment & Plan Note (Signed)
Continue vitamin d supplement 

## 2022-03-20 NOTE — Telephone Encounter (Signed)
Requested Prescriptions  Pending Prescriptions Disp Refills   Multiple Vitamin (THEREMS) TABS [Pharmacy Med Name: Therems Tablet] 90 tablet 0    Sig: TAKE 1 TABLET BY MOUTH ONCE DAILY     There is no refill protocol information for this order     omega-3 fish oil (MAXEPA) 1000 MG CAPS capsule [Pharmacy Med Name: Omega-3 Fish Oil 1000 MG Capsule] 90 capsule 0    Sig: TAKE 1 CAPSULE BY MOUTH ONCE DAILY     Off-Protocol Failed - 03/19/2022  1:52 PM      Failed - Medication not assigned to a protocol, review manually.      Passed - Valid encounter within last 12 months    Recent Outpatient Visits           6 months ago Dysuria   [facility] Caro Laroche, DO   7 months ago Hospital discharge follow-up   [facility] Berniece Salines, FNP   10 months ago Schizoaffective disorder, unspecified type [facility])   [facility] Berniece Salines, FNP       Future Appointments             Today Zane Herald, Rudolpho Sevin, FNP [facility], [facility]           Endocrinology:  Nutritional Agents - omega-3 acid ethyl esters Failed - 03/19/2022  1:52 PM      Failed - Lipid Panel in normal range within the last 12 months    Cholesterol  Date Value Ref Range Status  05/09/2021 125 <200 mg/dL Final   LDL Cholesterol (Calc)  Date Value Ref Range Status  05/09/2021 70 mg/dL (calc) Final    Comment:    Reference range: <100 . Desirable range <100 mg/dL for primary prevention;   <70 mg/dL for patients with CHD or diabetic patients  with > or = 2 CHD risk factors. Marland Kitchen LDL-C is now calculated using the Martin-Hopkins  calculation, which is a validated novel method providing  better accuracy than the Friedewald equation in the  estimation of LDL-C.  Horald Pollen et al. Lenox Ahr. 3016;010(93): 2061-2068  (http://education.[facility].com/faq/FAQ164)    HDL  Date Value Ref Range Status  05/09/2021 38 (L) > OR = 40 mg/dL Final    Triglycerides  Date Value Ref Range Status  05/09/2021 88 <150 mg/dL Final         Passed - Valid encounter within last 12 months    Recent Outpatient Visits           6 months ago Dysuria   [facility] Caro Laroche, DO   7 months ago Hospital discharge follow-up   [facility] Berniece Salines, FNP   10 months ago Schizoaffective disorder, unspecified type [facility])   [facility] Berniece Salines, FNP       Future Appointments             Today Zane Herald, Rudolpho Sevin, FNP [facility], [facility]           Over the Counter:  OTC Passed - 03/19/2022  1:52 PM      Passed - Valid encounter within last 12 months    Recent Outpatient Visits           6 months ago Dysuria   [facility] Caro Laroche, DO   7 months ago Hospital discharge follow-up   [facility] Della Goo  F, FNP   10 months ago Schizoaffective disorder, unspecified type [facility])   [facility] Della Goo F, FNP       Future Appointments             Today Zane Herald, Rudolpho Sevin, FNP [facility]             pioglitazone (ACTOS) 15 MG tablet [Pharmacy Med Name: Pioglitazone HCl 15 MG Tablet] 90 tablet 0    Sig: TAKE 1 TABLET BY MOUTH ONCE DAILY     Endocrinology:  Diabetes - Glitazones - pioglitazone Failed - 03/19/2022  1:52 PM      Failed - HBA1C is between 0 and 7.9 and within 180 days    Hgb A1c MFr Bld  Date Value Ref Range Status  08/02/2021 5.4 4.8 - 5.6 % Final    Comment:    (NOTE) Pre diabetes:          5.7%-6.4%  Diabetes:              >6.4%  Glycemic control for   <7.0% adults with diabetes          Failed - Valid encounter within last 6 months    Recent Outpatient Visits           6 months ago Dysuria   [facility] Caro Laroche, DO   7 months ago Hospital discharge follow-up   [facility] Berniece Salines, FNP   10 months ago Schizoaffective disorder, unspecified type [facility])   [facility] Berniece Salines, FNP       Future Appointments             Today Zane Herald, Rudolpho Sevin, FNP [facility], [facility]             simvastatin (ZOCOR) 40 MG tablet [Pharmacy Med Name: Simvastatin 40 MG Tablet] 90 tablet 0    Sig: TAKE 1 TABLET BY MOUTH EVERY NIGHT AT BEDTIME     Cardiovascular:  Antilipid - Statins Failed - 03/19/2022  1:52 PM      Failed - Lipid Panel in normal range within the last 12 months    Cholesterol  Date Value Ref Range Status  05/09/2021 125 <200 mg/dL Final   LDL Cholesterol (Calc)  Date Value Ref Range Status  05/09/2021 70 mg/dL (calc) Final    Comment:    Reference range: <100 . Desirable range <100 mg/dL for primary prevention;   <70 mg/dL for patients with CHD or diabetic patients  with > or = 2 CHD risk factors. Marland Kitchen LDL-C is now calculated using the Martin-Hopkins  calculation, which is a validated novel method providing  better accuracy than the Friedewald equation in the  estimation of LDL-C.  Horald Pollen et al. Lenox Ahr. 7989;211(94): 2061-2068  (http://education.[facility].com/faq/FAQ164)    HDL  Date Value Ref Range Status  05/09/2021 38 (L) > OR = 40 mg/dL Final   Triglycerides  Date Value Ref Range Status  05/09/2021 88 <150 mg/dL Final         Passed - Patient is not pregnant      Passed - Valid encounter within last 12 months    Recent Outpatient Visits           6 months ago Dysuria   [facility] Caro Laroche, DO   7 months ago Hospital discharge follow-up   [facility] Berniece Salines,  FNP   10 months ago Schizoaffective disorder, unspecified type [facility])   [facility] [place], Raynelle Fanning F, FNP       Future Appointments             Today Zane Herald, Rudolpho Sevin, FNP [facility], [facility]              tamsulosin (FLOMAX) 0.4 MG CAPS capsule [Pharmacy Med Name: Tamsulosin HCl 0.4 MG Capsule] 90 capsule 0    Sig: TAKE 1 CAPSULE BY MOUTH ONCE DAILY *DO NOT CRUSH OR CHEW* **DO NOT OPEN CAPSULE**     Urology: Alpha-Adrenergic Blocker Failed - 03/19/2022  1:52 PM      Failed - PSA in normal range and within 360 days    No results found for: "LABPSA", "PSA", "PSA1", "ULTRAPSA"       Passed - Last BP in normal range    BP Readings from Last 1 Encounters:  09/19/21 124/82         Passed - Valid encounter within last 12 months    Recent Outpatient Visits           6 months ago Dysuria   [facility] Caro Laroche, DO   7 months ago Hospital discharge follow-up   [facility] Berniece Salines, FNP   10 months ago Schizoaffective disorder, unspecified type [facility])   [facility] Berniece Salines, FNP       Future Appointments             Today Zane Herald, Rudolpho Sevin, FNP [facility], [facility]             lubiprostone (AMITIZA) 24 MCG capsule [Pharmacy Med Name: Lubiprostone 24 MCG Capsule] 180 capsule 0    Sig: TAKE 1 CAPSULE BY MOUTH TWICE DAILY WITH MEALS     Gastroenterology: Irritable Bowel Syndrome - lubiprostone Passed - 03/19/2022  1:52 PM      Passed - AST in normal range and within 360 days    AST  Date Value Ref Range Status  08/25/2021 15 15 - 41 U/L Final         Passed - ALT in normal range and within 360 days    ALT  Date Value Ref Range Status  08/25/2021 14 0 - 44 U/L Final         Passed - Valid encounter within last 12 months    Recent Outpatient Visits           6 months ago Dysuria   [facility] Caro Laroche, DO   7 months ago Hospital discharge follow-up   [facility] Berniece Salines, FNP   10 months ago Schizoaffective disorder, unspecified type [facility])   [facility] Berniece Salines, FNP       Future  Appointments             Today Zane Herald, Rudolpho Sevin, FNP [facility], [facility]

## 2022-03-20 NOTE — Assessment & Plan Note (Signed)
Continue depakote, follow up with neurology, appointment scheduled.

## 2022-03-21 LAB — LIPID PANEL
Cholesterol: 143 mg/dL (ref <200)
HDL: 44 mg/dL (ref >=40)
LDL Cholesterol (Calc): 79 mg/dL (calc)
Non-HDL Cholesterol (Calc): 99 mg/dL (calc) (ref <130)
Total CHOL/HDL Ratio: 3.3 (calc) (ref <5.0)
Triglycerides: 110 mg/dL (ref <150)

## 2022-03-21 LAB — COMPLETE METABOLIC PANEL WITH GFR
AG Ratio: 1.8 (calc) (ref 1.0–2.5)
ALT: 13 U/L (ref 9–46)
AST: 12 U/L (ref 10–35)
Albumin: 4.2 g/dL (ref 3.6–5.1)
Alkaline phosphatase (APISO): 54 U/L (ref 35–144)
BUN/Creatinine Ratio: 26 (calc) — ABNORMAL HIGH (ref 6–22)
BUN: 27 mg/dL — ABNORMAL HIGH (ref 7–25)
CO2: 32 mmol/L (ref 20–32)
Calcium: 9.6 mg/dL (ref 8.6–10.3)
Chloride: 107 mmol/L (ref 98–110)
Creat: 1.04 mg/dL (ref 0.70–1.30)
Globulin: 2.4 g/dL (calc) (ref 1.9–3.7)
Glucose, Bld: 107 mg/dL — ABNORMAL HIGH (ref 65–99)
Potassium: 5 mmol/L (ref 3.5–5.3)
Sodium: 146 mmol/L (ref 135–146)
Total Bilirubin: 0.4 mg/dL (ref 0.2–1.2)
Total Protein: 6.6 g/dL (ref 6.1–8.1)
eGFR: 83 mL/min/{1.73_m2} (ref >=60)

## 2022-03-21 LAB — CBC WITH DIFFERENTIAL/PLATELET
Absolute Monocytes: 328 cells/uL (ref 200–950)
Basophils Absolute: 29 cells/uL (ref 0–200)
Basophils Relative: 0.6 %
Eosinophils Absolute: 29 cells/uL (ref 15–500)
Eosinophils Relative: 0.6 %
HCT: 41.8 % (ref 38.5–50.0)
Hemoglobin: 14.3 g/dL (ref 13.2–17.1)
Lymphs Abs: 1220 cells/uL (ref 850–3900)
MCH: 29.9 pg (ref 27.0–33.0)
MCHC: 34.2 g/dL (ref 32.0–36.0)
MCV: 87.4 fL (ref 80.0–100.0)
MPV: 10.6 fL (ref 7.5–12.5)
Monocytes Relative: 6.7 %
Neutro Abs: 3293 cells/uL (ref 1500–7800)
Neutrophils Relative %: 67.2 %
Platelets: 213 10*3/uL (ref 140–400)
RBC: 4.78 10*6/uL (ref 4.20–5.80)
RDW: 12.7 % (ref 11.0–15.0)
Total Lymphocyte: 24.9 %
WBC: 4.9 10*3/uL (ref 3.8–10.8)

## 2022-03-21 LAB — VALPROIC ACID LEVEL: Valproic Acid Lvl: 42.8 mg/L — ABNORMAL LOW (ref 50.0–100.0)

## 2022-03-21 LAB — VITAMIN D 25 HYDROXY (VIT D DEFICIENCY, FRACTURES): Vit D, 25-Hydroxy: 69 ng/mL (ref 30–100)

## 2022-03-21 NOTE — Telephone Encounter (Signed)
Requested Prescriptions  Pending Prescriptions Disp Refills   cetirizine (ZYRTEC) 10 MG tablet [Pharmacy Med Name: Cetirizine HCl 10 MG Tablet] 90 tablet 0    Sig: TAKE 1 TABLET BY MOUTH ONCE DAILY     Ear, Nose, and Throat:  Antihistamines 2 Passed - 03/20/2022  5:18 PM      Passed - Cr in normal range and within 360 days    Creat  Date Value Ref Range Status  03/20/2022 1.04 0.70 - 1.30 mg/dL Final         Passed - Valid encounter within last 12 months    Recent Outpatient Visits           Yesterday Schizoaffective disorder, unspecified type [facility])   [facility] Berniece Salines, FNP   6 months ago Dysuria   [facility] Caro Laroche, DO   7 months ago Hospital discharge follow-up   [facility] Berniece Salines, FNP   10 months ago Schizoaffective disorder, unspecified type [facility])   [facility] Berniece Salines, FNP       Future Appointments             In 6 months Zane Herald, Rudolpho Sevin, FNP [facility], [facility]

## 2022-06-15 ENCOUNTER — Other Ambulatory Visit: Payer: Self-pay | Admitting: Nurse Practitioner

## 2022-06-17 NOTE — Telephone Encounter (Signed)
Requested medication (s) are due for refill today: yes  Requested medication (s) are on the active medication list: yes  Last refill:  06/04/21  Future visit scheduled: yes  Notes to clinic:   Manual Review: Route requests for 50,000 IU strength to the provider      Requested Prescriptions  Pending Prescriptions Disp Refills   VITAMIN D3 125 MCG (5000 UT) capsule [Pharmacy Med Name: Vitamin D3 125 MCG (5000 UT) Capsule] 28 capsule 10    Sig: TAKE 1 CAPSULE BY MOUTH ONCE DAILY  *TAKE WITH FOOD*     Endocrinology:  Vitamins - Vitamin D Supplementation 2 Failed - 06/15/2022  2:18 PM      Failed - Manual Review: Route requests for 50,000 IU strength to the provider      Passed - Ca in normal range and within 360 days    Calcium  Date Value Ref Range Status  03/20/2022 9.6 8.6 - 10.3 mg/dL Final   Calcium, Ion  Date Value Ref Range Status  08/02/2021 1.24 1.15 - 1.40 mmol/L Final         Passed - Vitamin D in normal range and within 360 days    Vit D, 25-Hydroxy  Date Value Ref Range Status  03/20/2022 69 30 - 100 ng/mL Final    Comment:    Vitamin D Status         25-OH Vitamin D: . Deficiency:                    <20 ng/mL Insufficiency:             20 - 29 ng/mL Optimal:                 > or = 30 ng/mL . For 25-OH Vitamin D testing on patients on  D2-supplementation and patients for whom quantitation  of D2 and D3 fractions is required, the [facility]ure[facility](TM) 25-OH VIT D, (D2,D3), LC/MS/MS is recommended: order  code [phone] (patients >95yrs). . See Note 1 . Note 1 . For additional information, please refer to  http://education.[facility].com/faq/FAQ199  (This link is being provided for informational/ educational purposes only.)          Passed - Valid encounter within last 12 months    Recent Outpatient Visits           2 months ago Schizoaffective disorder, unspecified type [facility])   [facility] Bo Merino, FNP   9 months ago  [place], DO   10 months ago Hospital discharge follow-up   [place], FNP   1 year ago Schizoaffective disorder, unspecified type [facility])   [facility] Bo Merino, [place]

## 2022-08-08 ENCOUNTER — Encounter: Payer: Self-pay | Admitting: Nurse Practitioner

## 2022-08-08 ENCOUNTER — Ambulatory Visit (INDEPENDENT_AMBULATORY_CARE_PROVIDER_SITE_OTHER): Payer: Medicare Other | Admitting: Nurse Practitioner

## 2022-08-08 ENCOUNTER — Other Ambulatory Visit: Payer: Self-pay

## 2022-08-08 VITALS — BP 130/80 | HR 75 | Temp 97.8°F | Resp 16 | Ht 71.25 in | Wt 230.3 lb

## 2022-08-08 DIAGNOSIS — J301 Allergic rhinitis due to pollen: Secondary | ICD-10-CM

## 2022-08-08 DIAGNOSIS — R569 Unspecified convulsions: Secondary | ICD-10-CM

## 2022-08-08 DIAGNOSIS — Z5181 Encounter for therapeutic drug level monitoring: Secondary | ICD-10-CM

## 2022-08-08 DIAGNOSIS — Z1212 Encounter for screening for malignant neoplasm of rectum: Secondary | ICD-10-CM

## 2022-08-08 DIAGNOSIS — Z131 Encounter for screening for diabetes mellitus: Secondary | ICD-10-CM

## 2022-08-08 DIAGNOSIS — E785 Hyperlipidemia, unspecified: Secondary | ICD-10-CM

## 2022-08-08 DIAGNOSIS — Z1159 Encounter for screening for other viral diseases: Secondary | ICD-10-CM

## 2022-08-08 DIAGNOSIS — N400 Enlarged prostate without lower urinary tract symptoms: Secondary | ICD-10-CM

## 2022-08-08 DIAGNOSIS — F259 Schizoaffective disorder, unspecified: Secondary | ICD-10-CM

## 2022-08-08 DIAGNOSIS — Z1211 Encounter for screening for malignant neoplasm of colon: Secondary | ICD-10-CM

## 2022-08-08 DIAGNOSIS — E559 Vitamin D deficiency, unspecified: Secondary | ICD-10-CM

## 2022-08-08 DIAGNOSIS — K5909 Other constipation: Secondary | ICD-10-CM

## 2022-08-08 LAB — CBC WITH DIFFERENTIAL/PLATELET
Absolute Monocytes: 320 cells/uL (ref 200–950)
Basophils Absolute: 32 cells/uL (ref 0–200)
HCT: 41.6 % (ref 38.5–50.0)
MCH: 29.1 pg (ref 27.0–33.0)
MPV: 10.4 fL (ref 7.5–12.5)
Monocytes Relative: 7.1 %
Platelets: 210 10*3/uL (ref 140–400)

## 2022-08-08 MED ORDER — SIMVASTATIN 40 MG PO TABS: 40.0000 mg | ORAL_TABLET | Freq: Every day | ORAL | 1 refills | Status: DC

## 2022-08-08 MED ORDER — CETIRIZINE HCL 10 MG PO TABS: 10.0000 mg | ORAL_TABLET | Freq: Every day | ORAL | 1 refills | Status: DC

## 2022-08-08 MED ORDER — PIOGLITAZONE HCL 15 MG PO TABS: 15.0000 mg | ORAL_TABLET | Freq: Every day | ORAL | 1 refills | Status: DC

## 2022-08-08 MED ORDER — TAMSULOSIN HCL 0.4 MG PO CAPS: ORAL_CAPSULE | ORAL | 1 refills | Status: DC

## 2022-08-08 MED ORDER — VITAMIN D3 125 MCG (5000 UT) PO CAPS
5000.0000 [IU] | ORAL_CAPSULE | Freq: Every day | ORAL | 1 refills | Status: DC
Start: 2022-08-08 — End: 2023-02-19

## 2022-08-08 NOTE — Assessment & Plan Note (Signed)
Is established with neurology.  He reports he is going to be having some testing done here soon.  Currently taking Depakote 500 mg twice daily.

## 2022-08-08 NOTE — Assessment & Plan Note (Signed)
Continue taking  seroquel 200 mg at bedtime, fluphenazine 5 mg at bedtime, risperidone 2 mg BID and hydroxyzine 50 mg TID as needed. Patient reports he is doing well. Patient is currently on actos due to his mental health medication. Refill sent.

## 2022-08-08 NOTE — Assessment & Plan Note (Signed)
Continue taking simvastatin 40 mg daily. 

## 2022-08-08 NOTE — Patient Instructions (Addendum)
Contact Gastroenterology for follow up of constipation/ bowel incontinence and colonoscopy.  [facility] [address] [place], [place] [address] Appointments [phone]

## 2022-08-08 NOTE — Assessment & Plan Note (Signed)
Patient last seen by GI over a year ago.  Patient states he still having some stool.  He is also due for colonoscopy.  Referral placed to GI.

## 2022-08-08 NOTE — Progress Notes (Signed)
BP 130/80   Pulse 75   Temp 97.8 F (36.6 C) (Oral)   Resp 16   Ht 5' 11.25" (1.81 m)   Wt 230 lb 4.8 oz (104.5 kg)   SpO2 95%   BMI 31.90 kg/m    Subjective:    Patient ID: Tyrone Little, male    DOB: 15-Nov-1960, 62 y.o.   MRN: [id number]  HPI: Tyrone Little is a 62 y.o. male  No chief complaint on file.   Schizoaffective disorder: he is under the care of [facility] in [place].  He currently takes seroquel 200 mg at bedtime, fluphenazine 5 mg at bedtime, risperidone 2 mg BID and hydroxyzine 50 mg TID as needed. Patient reports he is doing well. Patient is currently on actos due to his mental health medication. Refill sent.      08/08/2022    1:22 PM 03/20/2022    1:27 PM 09/19/2021    1:05 PM 08/07/2021    3:27 PM 05/09/2021   11:26 AM  Depression screen PHQ 2/9  Decreased Interest 0 0 0 0 0  Down, Depressed, Hopeless 0 0 0 0 0  PHQ - 2 Score 0 0 0 0 0  Altered sleeping 0    0  Tired, decreased energy 0    0  Change in appetite 0    0  Feeling bad or failure about yourself  0    0  Trouble concentrating 0    0  Moving slowly or fidgety/restless 0    0  Suicidal thoughts 0    0  PHQ-9 Score 0    0  Difficult doing work/chores Not difficult at all    Not difficult at all    Hyperlipidemia: his last LDL was 79 on 03/20/2022.  He currently takes simvastatin 40 mg daily.  He denies any myalgia.  Refills sent.  The 10-year ASCVD risk score (Arnett DK, et al., 2019) is: 7.2%   Values used to calculate the score:     Age: 32 years     Sex: Male     Is Non-Hispanic African American: No     Diabetic: No     Tobacco smoker: No     Systolic Blood Pressure: 130 mmHg     Is BP treated: No     HDL Cholesterol: 44 mg/dL     Total Cholesterol: 143 mg/dL    Vitamin D deficiency: he is currently taking vitamin d supplement.  Last vitamin d level was 69 03/20/2022.   Chronic constipation: he does see GI,  last seen on 05/28/2021. He currently takes amitiza 24 mcg two times a  day, miralax and stool softener.  Patient has also reached out to gi because patient is having stool incontinence.  He recommended that he do miralax 17 grams twice daily and trilyte prep. Patient is due for colonoscopy, recommend follow up with GI. Has not followed up with GI yet.  Placed a referral and provided information to [facility] group home.    Seizure like activity: patient was seen at the er in May for altered mental status and they suspected he had a seizure, started him on Depakote.  Patient denies any recent episodes. Patient was supposed to see neurology.  Patient had appointment with neurology on 07/09/2022. Plan is : - Schedule routine and 3-hour EEG to evaluate seizure-like activity-Check labs today: Depakote level CBC with differential, CMP. -Continue Depakote 500 mg sprinkle twice daily.- - Discussed seizure precautions with  patient, including: - No driving for 6 months after most recent seizure or spell, per [place] law. - Avoid activities that are potentially dangerous if you were to have another seizure, including operating heavy machinery, swimming, taking baths, climbing heights. Please use direct supervision around stoves, ovens, fireplaces, campfires, or other sources of heat or fire.  - Make sure you are taking medications as directed, avoiding alcohol and drug use, getting adequate sleep and eating regular meals.   Follow up after EEG.   BPH:  currently takes flomax 0.4 mg daily. He reports he occasionally has some incontinence but otherwise is doing well.   Relevant past medical, surgical, family and social history reviewed and updated as indicated. Interim medical history since our last visit reviewed. Allergies and medications reviewed and updated.  Review of Systems  Constitutional: Negative for fever or weight change.  Respiratory: Negative for cough and shortness of breath.   Cardiovascular: Negative for chest pain or palpitations.  Gastrointestinal:  Negative for abdominal pain, no bowel changes.  Musculoskeletal: Negative for gait problem or joint swelling.  Skin: Negative for rash.  Neurological: Negative for dizziness or headache.  No other specific complaints in a complete review of systems (except as listed in HPI above).      Objective:    BP 130/80   Pulse 75   Temp 97.8 F (36.6 C) (Oral)   Resp 16   Ht 5' 11.25" (1.81 m)   Wt 230 lb 4.8 oz (104.5 kg)   SpO2 95%   BMI 31.90 kg/m   Wt Readings from Last 3 Encounters:  08/08/22 230 lb 4.8 oz (104.5 kg)  03/20/22 233 lb 14.4 oz (106.1 kg)  09/19/21 234 lb 4.8 oz (106.3 kg)    Physical Exam  Constitutional: Patient appears well-developed and well-nourished. Obese  No distress.  HEENT: head atraumatic, normocephalic, pupils equal and reactive to light, neck supple Cardiovascular: Normal rate, regular rhythm and normal heart sounds.  No murmur heard. No BLE edema. Pulmonary/Chest: Effort normal and breath sounds normal. No respiratory distress. Abdominal: Soft.  There is no tenderness. Neuro: equal grip, no arm drift Psychiatric: Patient has a normal mood and affect. behavior is normal. Judgment and thought content normal.  Results for orders placed or performed in visit on 03/20/22  Valproic Acid level  Result Value Ref Range   Valproic Acid Lvl 42.8 (L) 50.0 - 100.0 mg/L  CBC with Differential/Platelet  Result Value Ref Range   WBC 4.9 3.8 - 10.8 Thousand/uL   RBC 4.78 4.20 - 5.80 Million/uL   Hemoglobin 14.3 13.2 - 17.1 g/dL   HCT 45.4 09.8 - 11.9 %   MCV 87.4 80.0 - 100.0 fL   MCH 29.9 27.0 - 33.0 pg   MCHC 34.2 32.0 - 36.0 g/dL   RDW 14.7 82.9 - 56.2 %   Platelets 213 140 - 400 Thousand/uL   MPV 10.6 7.5 - 12.5 fL   Neutro Abs 3,293 1,500 - 7,800 cells/uL   Lymphs Abs 1,220 850 - 3,900 cells/uL   Absolute Monocytes 328 200 - 950 cells/uL   Eosinophils Absolute 29 15 - 500 cells/uL   Basophils Absolute 29 0 - 200 cells/uL   Neutrophils Relative % 67.2 %    Total Lymphocyte 24.9 %   Monocytes Relative 6.7 %   Eosinophils Relative 0.6 %   Basophils Relative 0.6 %  COMPLETE METABOLIC PANEL WITH GFR  Result Value Ref Range   Glucose, Bld 107 (H) 65 - 99 mg/dL  BUN 27 (H) 7 - 25 mg/dL   Creat 1.61 0.96 - 0.45 mg/dL   eGFR 83 > OR = 60 WU/JWJ/1.91Y7   BUN/Creatinine Ratio 26 (H) 6 - 22 (calc)   Sodium 146 135 - 146 mmol/L   Potassium 5.0 3.5 - 5.3 mmol/L   Chloride 107 98 - 110 mmol/L   CO2 32 20 - 32 mmol/L   Calcium 9.6 8.6 - 10.3 mg/dL   Total Protein 6.6 6.1 - 8.1 g/dL   Albumin 4.2 3.6 - 5.1 g/dL   Globulin 2.4 1.9 - 3.7 g/dL (calc)   AG Ratio 1.8 1.0 - 2.5 (calc)   Total Bilirubin 0.4 0.2 - 1.2 mg/dL   Alkaline phosphatase (APISO) 54 35 - 144 U/L   AST 12 10 - 35 U/L   ALT 13 9 - 46 U/L  Lipid panel  Result Value Ref Range   Cholesterol 143 <200 mg/dL   HDL 44 > OR = 40 mg/dL   Triglycerides 829 <562 mg/dL   LDL Cholesterol (Calc) 79 mg/dL (calc)   Total CHOL/HDL Ratio 3.3 <5.0 (calc)   Non-HDL Cholesterol (Calc) 99 <130 mg/dL (calc)  VITAMIN D 25 Hydroxy (Vit-D Deficiency, Fractures)  Result Value Ref Range   Vit D, 25-Hydroxy 69 30 - 100 ng/mL      Assessment & Plan:   Problem List Items Addressed This Visit       Digestive   Chronic constipation with overflow incontinence    Patient last seen by GI over a year ago.  Patient states he still having some stool.  He is also due for colonoscopy.  Referral placed to GI.        Other   Hyperlipidemia    Continue taking simvastatin 40 mg daily.      Relevant Medications   simvastatin (ZOCOR) 40 MG tablet   Other Relevant Orders   COMPLETE METABOLIC PANEL WITH GFR   Lipid panel   Schizoaffective disorder (HCC) - Primary    Continue taking  seroquel 200 mg at bedtime, fluphenazine 5 mg at bedtime, risperidone 2 mg BID and hydroxyzine 50 mg TID as needed. Patient reports he is doing well. Patient is currently on actos due to his mental health medication. Refill  sent.       Vitamin D deficiency    Taking vitamin D supplementation.      Relevant Medications   Cholecalciferol (VITAMIN D3) 125 MCG (5000 UT) CAPS   Seizure-like activity (HCC)    Is established with neurology.  He reports he is going to be having some testing done here soon.  Currently taking Depakote 500 mg twice daily.      Other Visit Diagnoses     Encounter for colorectal cancer screening       Relevant Orders   Ambulatory referral to Gastroenterology   Medication monitoring encounter       Relevant Orders   CBC with Differential/Platelet   COMPLETE METABOLIC PANEL WITH GFR   Hemoglobin A1c   Lipid panel   Benign prostatic hyperplasia without lower urinary tract symptoms       Relevant Medications   tamsulosin (FLOMAX) 0.4 MG CAPS capsule   Seasonal allergic rhinitis due to pollen       Relevant Medications   cetirizine (ZYRTEC) 10 MG tablet   Encounter for hepatitis C screening test for low risk patient       Relevant Orders   Hepatitis C antibody   Screening for diabetes mellitus  Relevant Orders   COMPLETE METABOLIC PANEL WITH GFR   Hemoglobin A1c        Follow up plan: Return in about 6 months (around 02/08/2023) for follow up.

## 2022-08-08 NOTE — Assessment & Plan Note (Signed)
Taking vitamin D supplementation.

## 2022-08-09 LAB — COMPLETE METABOLIC PANEL WITH GFR
AG Ratio: 1.7 (calc) (ref 1.0–2.5)
ALT: 10 U/L (ref 9–46)
AST: 12 U/L (ref 10–35)
Albumin: 3.8 g/dL (ref 3.6–5.1)
Alkaline phosphatase (APISO): 45 U/L (ref 35–144)
BUN: 21 mg/dL (ref 7–25)
CO2: 31 mmol/L (ref 20–32)
Calcium: 8.8 mg/dL (ref 8.6–10.3)
Chloride: 105 mmol/L (ref 98–110)
Creat: 1.01 mg/dL (ref 0.70–1.35)
Globulin: 2.3 g/dL (calc) (ref 1.9–3.7)
Glucose, Bld: 83 mg/dL (ref 65–99)
Potassium: 4.4 mmol/L (ref 3.5–5.3)
Sodium: 143 mmol/L (ref 135–146)
Total Bilirubin: 0.4 mg/dL (ref 0.2–1.2)
Total Protein: 6.1 g/dL (ref 6.1–8.1)
eGFR: 85 mL/min/{1.73_m2} (ref >=60)

## 2022-08-09 LAB — CBC WITH DIFFERENTIAL/PLATELET
Basophils Relative: 0.7 %
Eosinophils Absolute: 41 cells/uL (ref 15–500)
Eosinophils Relative: 0.9 %
Hemoglobin: 14 g/dL (ref 13.2–17.1)
Lymphs Abs: 1485 cells/uL (ref 850–3900)
MCHC: 33.7 g/dL (ref 32.0–36.0)
MCV: 86.5 fL (ref 80.0–100.0)
Neutro Abs: 2624 cells/uL (ref 1500–7800)
Neutrophils Relative %: 58.3 %
RBC: 4.81 10*6/uL (ref 4.20–5.80)
RDW: 14.1 % (ref 11.0–15.0)
Total Lymphocyte: 33 %
WBC: 4.5 10*3/uL (ref 3.8–10.8)

## 2022-08-09 LAB — HEMOGLOBIN A1C
Hgb A1c MFr Bld: 5.4 % of total Hgb (ref <5.7)
Mean Plasma Glucose: 108 mg/dL
eAG (mmol/L): 6 mmol/L

## 2022-08-09 LAB — LIPID PANEL
Cholesterol: 134 mg/dL (ref <200)
HDL: 41 mg/dL (ref >=40)
LDL Cholesterol (Calc): 75 mg/dL (calc)
Non-HDL Cholesterol (Calc): 93 mg/dL (calc) (ref <130)
Total CHOL/HDL Ratio: 3.3 (calc) (ref <5.0)
Triglycerides: 96 mg/dL (ref <150)

## 2022-08-09 LAB — HEPATITIS C ANTIBODY: Hepatitis C Ab: NONREACTIVE

## 2022-08-21 ENCOUNTER — Telehealth: Payer: Self-pay | Admitting: Nurse Practitioner

## 2022-08-21 NOTE — Telephone Encounter (Signed)
Last seen 5.9.2024 for regular fu. Neysa Bonito stated she has already emailed the paperwork in.

## 2022-08-21 NOTE — Telephone Encounter (Signed)
Spoke with Neysa Bonito and appt sch'd June 10th

## 2022-08-21 NOTE — Telephone Encounter (Signed)
Need appointment on June 9

## 2022-08-21 NOTE — Telephone Encounter (Signed)
Copied from CRM [id number]. Topic: General - Other >> Aug 21, 2022  9:38 AM [place] B wrote: Reason for CRM: Neysa Bonito , worker for pt called in says needs FL2 form renewed.

## 2022-09-05 ENCOUNTER — Other Ambulatory Visit: Payer: Self-pay | Admitting: Nurse Practitioner

## 2022-09-06 NOTE — Telephone Encounter (Signed)
Requested medications are due for refill today.  yes  Requested medications are on the active medications list.  yes  Last refill. 03/20/2022 90/0 for both  Future visit scheduled.   yes  Notes to clinic.  No protocol.Please review for refill.    Requested Prescriptions  Pending Prescriptions Disp Refills   Multiple Vitamin (THEREMS) TABS [Pharmacy Med Name: Therems Tablet] 28 tablet 10    Sig: TAKE 1 TABLET BY MOUTH ONCE DAILY     There is no refill protocol information for this order     omega-3 fish oil (MAXEPA) 1000 MG CAPS capsule [Pharmacy Med Name: Omega-3 Fish Oil 1000 MG Capsule] 28 capsule 10    Sig: TAKE 1 CAPSULE BY MOUTH ONCE DAILY *TAKE WITH FOOD*     Off-Protocol Failed - 09/05/2022  3:21 PM      Failed - Medication not assigned to a protocol, review manually.      Passed - Valid encounter within last 12 months    Recent Outpatient Visits           4 weeks ago Schizoaffective disorder, unspecified type [facility])   [place] Della Goo F, FNP   5 months ago Schizoaffective disorder, unspecified type [facility])   [facility] Berniece Salines, FNP   11 months ago Dysuria   [facility] Caro Laroche, DO   1 year ago Hospital discharge follow-up   [facility] Berniece Salines, FNP   1 year ago Schizoaffective disorder, unspecified type [facility])   [place] [facility] Berniece Salines, FNP       Future Appointments             In 3 days Zane Herald Rudolpho Sevin, FNP [facility]   In 2 months  [facility]   In 5 months Zane Herald, Rudolpho Sevin, [facility]           Endocrinology:  Nutritional Agents - omega-3 acid ethyl esters Failed - 09/05/2022  3:21 PM      Failed - Lipid Panel in normal range within the last 12 months    Cholesterol  Date Value Ref  Range Status  08/08/2022 134 <200 mg/dL Final   LDL Cholesterol (Calc)  Date Value Ref Range Status  08/08/2022 75 mg/dL (calc) Final    Comment:    Reference range: <100 . Desirable range <100 mg/dL for primary prevention;   <70 mg/dL for patients with CHD or diabetic patients  with > or = 2 CHD risk factors. Marland Kitchen LDL-C is now calculated using the Martin-Hopkins  calculation, which is a validated novel method providing  better accuracy than the Friedewald equation in the  estimation of LDL-C.  Horald Pollen et al. Lenox Ahr. 4098;119(14): 2061-2068  (http://education.[facility].com/faq/FAQ164)    HDL  Date Value Ref Range Status  08/08/2022 41 > OR = 40 mg/dL Final   Triglycerides  Date Value Ref Range Status  08/08/2022 96 <150 mg/dL Final         Passed - Valid encounter within last 12 months    Recent Outpatient Visits           4 weeks ago Schizoaffective disorder, unspecified type [facility])   [facility] Berniece Salines, FNP   5 months ago Schizoaffective disorder, unspecified type [facility])   [facility] [place], Raynelle Fanning  F, FNP   11 months ago Dysuria   [facility] Caro Laroche, DO   1 year ago Hospital discharge follow-up   [facility] Berniece Salines, FNP   1 year ago Schizoaffective disorder, unspecified type [facility])   [facility] Berniece Salines, FNP       Future Appointments             In 3 days Zane Herald, Rudolpho Sevin, FNP [facility]   In 2 months  [facility], [place]   In 5 months Zane Herald, Rudolpho Sevin, FNP [facility], [facility]           Over the Counter:  OTC Passed - 09/05/2022  3:21 PM      Passed - Valid encounter within last 12 months    Recent Outpatient Visits           4 weeks ago Schizoaffective disorder, unspecified type [facility])   [facility]  [facility] Berniece Salines, FNP   5 months ago Schizoaffective disorder, unspecified type [facility])   [facility] Berniece Salines, FNP   11 months ago Dysuria   [facility] Caro Laroche, DO   1 year ago Hospital discharge follow-up   [facility] Berniece Salines, FNP   1 year ago Schizoaffective disorder, unspecified type [facility])   [facility] Berniece Salines, FNP       Future Appointments             In 3 days Zane Herald, Rudolpho Sevin, FNP [facility]   In 2 months  [facility]   In 5 months Zane Herald, Rudolpho Sevin, FNP [facility]            Signed Prescriptions Disp Refills   lubiprostone (AMITIZA) 24 MCG capsule 180 capsule 3    Sig: TAKE 1 CAPSULE BY MOUTH TWICE DAILY WITH MEALS     Gastroenterology: Irritable Bowel Syndrome - lubiprostone Passed - 09/05/2022  3:21 PM      Passed - AST in normal range and within 360 days    AST  Date Value Ref Range Status  08/08/2022 12 10 - 35 U/L Final         Passed - ALT in normal range and within 360 days    ALT  Date Value Ref Range Status  08/08/2022 10 9 - 46 U/L Final         Passed - Valid encounter within last 12 months    Recent Outpatient Visits           4 weeks ago Schizoaffective disorder, unspecified type [facility])   [facility] Berniece Salines, FNP   5 months ago Schizoaffective disorder, unspecified type [facility])   [facility] Salines, FNP   11 months ago Dysuria   [facility] Caro Laroche, DO   1 year ago Hospital discharge follow-up   [facility] Berniece Salines, FNP   1 year ago Schizoaffective disorder, unspecified type [facility])   [facility] Berniece Salines, FNP        Future Appointments  In 3 days Zane Herald, Rudolpho Sevin, FNP [facility]   In 2 months  [facility], [place]   In 5 months Zane Herald, Rudolpho Sevin, FNP [facility], [facility]

## 2022-09-06 NOTE — Progress Notes (Signed)
Name: Tyrone Little   MRN: [id number]    DOB: 1961-02-09   Date:09/06/2022       Progress Note  Subjective  Chief Complaint  No chief complaint on file.   HPI  Patient presents for annual CPE  for FL2.  IPSS Questionnaire (AUA-7): Over the past month.   1)  How often have you had a sensation of not emptying your bladder completely after you finish urinating?  {Rating:19227}  2)  How often have you had to urinate again less than two hours after you finished urinating? {Rating:19227}  3)  How often have you found you stopped and started again several times when you urinated?  {Rating:19227}  4) How difficult have you found it to postpone urination?  {Rating:19227}  5) How often have you had a weak urinary stream?  {Rating:19227}  6) How often have you had to push or strain to begin urination?  {Rating:19227}  7) How many times did you most typically get up to urinate from the time you went to bed until the time you got up in the morning?  {Rating:19228}  Total score:  0-7 mildly symptomatic   8-19 moderately symptomatic   20-35 severely symptomatic     Diet: *** Exercise: *** Last Dental Exam: **** Last Eye Exam: ***  Depression: phq 9 is {gen pos WUJ:[id number]}    08/08/2022    1:22 PM 03/20/2022    1:27 PM 09/19/2021    1:05 PM 08/07/2021    3:27 PM 05/09/2021   11:26 AM  Depression screen PHQ 2/9  Decreased Interest 0 0 0 0 0  Down, Depressed, Hopeless 0 0 0 0 0  PHQ - 2 Score 0 0 0 0 0  Altered sleeping 0    0  Tired, decreased energy 0    0  Change in appetite 0    0  Feeling bad or failure about yourself  0    0  Trouble concentrating 0    0  Moving slowly or fidgety/restless 0    0  Suicidal thoughts 0    0  PHQ-9 Score 0    0  Difficult doing work/chores Not difficult at all    Not difficult at all    Hypertension:  BP Readings from Last 3 Encounters:  08/08/22 130/80  03/20/22 128/76  09/19/21 124/82    Obesity: Wt Readings from Last 3 Encounters:  08/08/22 230  lb 4.8 oz (104.5 kg)  03/20/22 233 lb 14.4 oz (106.1 kg)  09/19/21 234 lb 4.8 oz (106.3 kg)   BMI Readings from Last 3 Encounters:  08/08/22 31.90 kg/m  03/20/22 32.39 kg/m  09/19/21 32.45 kg/m     Lipids:  Lab Results  Component Value Date   CHOL 134 08/08/2022   CHOL 143 03/20/2022   CHOL 125 05/09/2021   Lab Results  Component Value Date   HDL 41 08/08/2022   HDL 44 03/20/2022   HDL 38 (L) 05/09/2021   Lab Results  Component Value Date   LDLCALC 75 08/08/2022   LDLCALC 79 03/20/2022   LDLCALC 70 05/09/2021   Lab Results  Component Value Date   TRIG 96 08/08/2022   TRIG 110 03/20/2022   TRIG 88 05/09/2021   Lab Results  Component Value Date   CHOLHDL 3.3 08/08/2022   CHOLHDL 3.3 03/20/2022   CHOLHDL 3.3 05/09/2021   No results found for: "LDLDIRECT" Glucose:  Glucose, Bld  Date Value Ref Range Status  08/08/2022 83 65 -  99 mg/dL Final    Comment:    .            Fasting reference interval .   03/20/2022 107 (H) 65 - 99 mg/dL Final    Comment:    .            Fasting reference interval . For someone without known diabetes, a glucose value between 100 and 125 mg/dL is consistent with prediabetes and should be confirmed with a follow-up test. .   08/25/2021 178 (H) 70 - 99 mg/dL Final    Comment:    Glucose reference range applies only to samples taken after fasting for at least 8 hours.   Glucose-Capillary  Date Value Ref Range Status  08/25/2021 168 (H) 70 - 99 mg/dL Final    Comment:    Glucose reference range applies only to samples taken after fasting for at least 8 hours.  08/06/2021 98 70 - 99 mg/dL Final    Comment:    Glucose reference range applies only to samples taken after fasting for at least 8 hours.  08/06/2021 108 (H) 70 - 99 mg/dL Final    Comment:    Glucose reference range applies only to samples taken after fasting for at least 8 hours.    Flowsheet Row Office Visit from 08/08/2022 in [facility]  AUDIT-C Score 0      ***  Single STD testing and prevention (HIV/chl/gon/syphilis):  {yes/no/default n/a:21102::"not applicable"} Sexual history:  Hep C Screening:  Skin cancer: Discussed monitoring for atypical lesions Colorectal cancer: over due Prostate cancer:  {yes/no/default n/a:21102::"not applicable"} No results found for: "PSA"   Lung cancer:  Low Dose CT Chest recommended if Age 60-80 years, 30 pack-year currently smoking OR have quit w/in 15years. Patient  {Response; is/is not/na:63} a candidate for screening   AAA: The USPSTF recommends one-time screening with ultrasonography in men ages 60 to 75 years who have ever smoked. Patient   {Response; is /is not/na:63}, a candidate for screening  ECG:  ***  Vaccines:   HPV:  Tdap:  Shingrix:  Pneumonia:  Flu:  COVID-19:  Advanced Care Planning: A voluntary discussion about advance care planning including the explanation and discussion of advance directives.  Discussed health care proxy and Living will, and the patient was able to identify a health care proxy as ***.  Patient {DOES_DOES ZOX:09604} have a living will and power of attorney of health care   Patient Active Problem List   Diagnosis Date Noted   Chronic constipation with overflow incontinence 03/20/2022   Seizure-like activity (HCC) 03/20/2022   Hypokalemia 08/04/2021   Hyperlipidemia 09/02/2018   Obesity 09/02/2018   Schizoaffective disorder (HCC) 09/02/2018   Vitamin D deficiency 09/02/2018    No past surgical history on file.  No family history on file.  Social History   Socioeconomic History   Marital status: Single    Spouse name: Not on file   Number of children: Not on file   Years of education: Not on file   Highest education level: Not on file  Occupational History   Not on file  Tobacco Use   Smoking status: Former    Types: Cigarettes    Quit date: 2007    Years since quitting: 17.4   Smokeless tobacco: Never  Vaping Use    Vaping Use: Never used  Substance and Sexual Activity   Alcohol use: Never   Drug use: Never   Sexual activity: Never  Other Topics Concern   Not on file  Social History Narrative   [facility] [address] [place], [place]  [phone]   Social Determinants of Health   Financial Resource Strain: Low Risk  (05/09/2021)   Overall Financial Resource Strain (CARDIA)    Difficulty of Paying Living Expenses: Not hard at all  Food Insecurity: No Food Insecurity (05/09/2021)   Hunger Vital Sign    Worried About Running Out of Food in the Last Year: Never true    Ran Out of Food in the Last Year: Never true  Transportation Needs: No Transportation Needs (05/09/2021)   PRAPARE - Administrator, Civil Service (Medical): No    Lack of Transportation (Non-Medical): No  Physical Activity: Sufficiently Active (05/09/2021)   Exercise Vital Sign    Days of Exercise per Week: 7 days    Minutes of Exercise per Session: 30 min  Stress: No Stress Concern Present (05/09/2021)   [employer] of Occupational Health - Occupational Stress Questionnaire    Feeling of Stress : Not at all  Social Connections: Moderately Integrated (05/09/2021)   Social Connection and Isolation Panel [NHANES]    Frequency of Communication with Friends and Family: More than three times a week    Frequency of Social Gatherings with Friends and Family: Not on file    Attends Religious Services: More than 4 times per year    Active Member of [employer] or Organizations: Yes    Attends Banker Meetings: Not on file    Marital Status: Never married  Intimate Partner Violence: Not At Risk (05/09/2021)   Humiliation, Afraid, Rape, and Kick questionnaire    Fear of Current or Ex-Partner: No    Emotionally Abused: No    Physically Abused: No    Sexually Abused: No     Current Outpatient Medications:    acetaminophen (TYLENOL) 500 MG tablet, Take 2 tablets (1,000 mg total) by mouth every 8 (eight) hours  as needed for fever (pain)., Disp: 90 tablet, Rfl: 1   cetirizine (ZYRTEC) 10 MG tablet, Take 1 tablet (10 mg total) by mouth daily., Disp: 90 tablet, Rfl: 1   Cholecalciferol (VITAMIN D3) 125 MCG (5000 UT) CAPS, Take 1 capsule (5,000 Units total) by mouth daily., Disp: 90 capsule, Rfl: 1   divalproex (DEPAKOTE SPRINKLE) 125 MG capsule, Take 4 capsules (500 mg total) by mouth every 12 (twelve) hours., Disp: 240 capsule, Rfl: 2   fluPHENAZine (PROLIXIN) 5 MG tablet, Take 5 mg by mouth at bedtime., Disp: , Rfl:    ibuprofen (ADVIL) 600 MG tablet, Take 1 tablet (600 mg total) by mouth every 6 (six) hours as needed (pain)., Disp: 90 tablet, Rfl: 1   Incontinence Supply Disposable (COMFORT SHIELD ADULT DIAPERS) MISC, 60 each by Does not apply route in the morning and at bedtime. (Patient not taking: Reported on 08/08/2022), Disp: 48 each, Rfl: 12   lubiprostone (AMITIZA) 24 MCG capsule, TAKE 1 CAPSULE BY MOUTH TWICE DAILY WITH MEALS, Disp: 180 capsule, Rfl: 3   Multiple Vitamin (THEREMS) TABS, TAKE 1 TABLET BY MOUTH ONCE DAILY, Disp: 90 tablet, Rfl: 0   omega-3 fish oil (MAXEPA) 1000 MG CAPS capsule, TAKE 1 CAPSULE BY MOUTH ONCE DAILY, Disp: 90 capsule, Rfl: 0   OVER THE COUNTER MEDICATION, Take 1 Scoop by mouth daily. Best Fiber Powder, Disp: , Rfl:    pioglitazone (ACTOS) 15 MG tablet, Take 1 tablet (15 mg total) by mouth daily., Disp: 90 tablet, Rfl:  1   QUEtiapine (SEROQUEL) 200 MG tablet, Take 200 mg by mouth at bedtime., Disp: , Rfl:    risperiDONE (RISPERDAL) 2 MG tablet, Take 2 mg by mouth 2 (two) times daily., Disp: , Rfl:    simvastatin (ZOCOR) 40 MG tablet, Take 1 tablet (40 mg total) by mouth at bedtime., Disp: 90 tablet, Rfl: 1   tamsulosin (FLOMAX) 0.4 MG CAPS capsule, TAKE 1 CAPSULE BY MOUTH ONCE DAILY *DO NOT CRUSH OR CHEW* **DO NOT OPEN CAPSULE**, Disp: 90 capsule, Rfl: 1  No Known Allergies   ROS  ***   Objective  There were no vitals filed for this visit.  There is no height  or weight on file to calculate BMI.  Physical Exam ***  Recent Results (from the past 2160 hour(s))  CBC with Differential/Platelet     Status: None   Collection Time: 08/08/22  2:03 PM  Result Value Ref Range   WBC 4.5 3.8 - 10.8 Thousand/uL   RBC 4.81 4.20 - 5.80 Million/uL   Hemoglobin 14.0 13.2 - 17.1 g/dL   HCT 16.1 09.6 - 04.5 %   MCV 86.5 80.0 - 100.0 fL   MCH 29.1 27.0 - 33.0 pg   MCHC 33.7 32.0 - 36.0 g/dL   RDW 40.9 81.1 - 91.4 %   Platelets 210 140 - 400 Thousand/uL   MPV 10.4 7.5 - 12.5 fL   Neutro Abs 2,624 1,500 - 7,800 cells/uL   Lymphs Abs 1,485 850 - 3,900 cells/uL   Absolute Monocytes 320 200 - 950 cells/uL   Eosinophils Absolute 41 15 - 500 cells/uL   Basophils Absolute 32 0 - 200 cells/uL   Neutrophils Relative % 58.3 %   Total Lymphocyte 33.0 %   Monocytes Relative 7.1 %   Eosinophils Relative 0.9 %   Basophils Relative 0.7 %  COMPLETE METABOLIC PANEL WITH GFR     Status: None   Collection Time: 08/08/22  2:03 PM  Result Value Ref Range   Glucose, Bld 83 65 - 99 mg/dL    Comment: .            Fasting reference interval .    BUN 21 7 - 25 mg/dL   Creat 7.82 9.56 - 2.13 mg/dL   eGFR 85 > OR = 60 YQ/MVH/8.46N6   BUN/Creatinine Ratio SEE NOTE: 6 - 22 (calc)    Comment:    Not Reported: BUN and Creatinine are within    reference range. .    Sodium 143 135 - 146 mmol/L   Potassium 4.4 3.5 - 5.3 mmol/L   Chloride 105 98 - 110 mmol/L   CO2 31 20 - 32 mmol/L   Calcium 8.8 8.6 - 10.3 mg/dL   Total Protein 6.1 6.1 - 8.1 g/dL   Albumin 3.8 3.6 - 5.1 g/dL   Globulin 2.3 1.9 - 3.7 g/dL (calc)   AG Ratio 1.7 1.0 - 2.5 (calc)   Total Bilirubin 0.4 0.2 - 1.2 mg/dL   Alkaline phosphatase (APISO) 45 35 - 144 U/L   AST 12 10 - 35 U/L   ALT 10 9 - 46 U/L  Hemoglobin A1c     Status: None   Collection Time: 08/08/22  2:03 PM  Result Value Ref Range   Hgb A1c MFr Bld 5.4 <5.7 % of total Hgb    Comment: For the purpose of screening for the presence  of diabetes: . <5.7%       Consistent with the absence of diabetes 5.7-6.4%  Consistent with increased risk for diabetes             (prediabetes) > or =6.5%  Consistent with diabetes . This assay result is consistent with a decreased risk of diabetes. . Currently, no consensus exists regarding use of hemoglobin A1c for diagnosis of diabetes in children. . According to American Diabetes Association (ADA) guidelines, hemoglobin A1c <7.0% represents optimal control in non-pregnant diabetic patients. Different metrics may apply to specific patient populations.  Standards of Medical Care in Diabetes(ADA). .    Mean Plasma Glucose 108 mg/dL   eAG (mmol/L) 6.0 mmol/L    Comment: . This test was performed on the Roche cobas c503 platform. Effective 01/07/22, a change in test platforms from the Abbott Architect to the Roche cobas c503 may have shifted HbA1c results compared to historical results. Based on laboratory validation testing conducted at [facility], the Roche platform relative to the Abbott platform had an average increase in HbA1c value of < or = 0.3%. This difference is within accepted  variability established by the [facility]. Note that not all individuals will have had a shift in their results and direct comparisons between historical and current results for testing conducted on different platforms is not recommended.   Hepatitis C antibody     Status: None   Collection Time: 08/08/22  2:03 PM  Result Value Ref Range   Hepatitis C Ab NON-REACTIVE NON-REACTIVE    Comment: . HCV antibody was non-reactive. There is no laboratory  evidence of HCV infection. . In most cases, no further action is required. However, if recent HCV exposure is suspected, a test for HCV RNA (test code 66440) is suggested. . For additional information please refer to http://education.[facility].com/faq/FAQ22v1 (This link is being provided for  informational/ educational purposes only.) .   Lipid panel     Status: None   Collection Time: 08/08/22  2:03 PM  Result Value Ref Range   Cholesterol 134 <200 mg/dL   HDL 41 > OR = 40 mg/dL   Triglycerides 96 <347 mg/dL   LDL Cholesterol (Calc) 75 mg/dL (calc)    Comment: Reference range: <100 . Desirable range <100 mg/dL for primary prevention;   <70 mg/dL for patients with CHD or diabetic patients  with > or = 2 CHD risk factors. Marland Kitchen LDL-C is now calculated using the Martin-Hopkins  calculation, which is a validated novel method providing  better accuracy than the Friedewald equation in the  estimation of LDL-C.  Horald Pollen et al. Lenox Ahr. 4259;563(87): 2061-2068  (http://education.[facility].com/faq/FAQ164)    Total CHOL/HDL Ratio 3.3 <5.0 (calc)   Non-HDL Cholesterol (Calc) 93 <564 mg/dL (calc)    Comment: For patients with diabetes plus 1 major ASCVD risk  factor, treating to a non-HDL-C goal of <100 mg/dL  (LDL-C of <33 mg/dL) is considered a therapeutic  option.      Fall Risk:    08/08/2022    1:22 PM 03/20/2022    1:27 PM 09/19/2021    1:04 PM 08/07/2021    3:27 PM 05/09/2021   10:54 AM  Fall Risk   Falls in the past year? 0 0 0 0 1  Number falls in past yr: 0 0 0 0 0  Injury with Fall? 0 0 0 0 1  Risk for fall due to :     History of fall(s)  Follow up  Falls evaluation completed Falls evaluation completed Falls evaluation completed Falls evaluation completed     Functional Status Survey:  Assessment & Plan  There are no diagnoses linked to this encounter.   -Prostate cancer screening and PSA options (with potential risks and benefits of testing vs not testing) were discussed along with recent recs/guidelines. -USPSTF grade A and B recommendations reviewed with patient; age-appropriate recommendations, preventive care, screening tests, etc discussed and encouraged; healthy living encouraged; see AVS for patient education given to  patient -Discussed importance of 150 minutes of physical activity weekly, eat two servings of fish weekly, eat one serving of tree nuts ( cashews, pistachios, pecans, almonds.Marland Kitchen) every other day, eat 6 servings of fruit/vegetables daily and drink plenty of water and avoid sweet beverages.  -Reviewed Health Maintenance: {yes/no/default n/a:21102::"not applicable"}

## 2022-09-06 NOTE — Telephone Encounter (Signed)
Requested Prescriptions  Pending Prescriptions Disp Refills   Multiple Vitamin (THEREMS) TABS [Pharmacy Med Name: Therems Tablet] 28 tablet 10    Sig: TAKE 1 TABLET BY MOUTH ONCE DAILY     There is no refill protocol information for this order     omega-3 fish oil (MAXEPA) 1000 MG CAPS capsule [Pharmacy Med Name: Omega-3 Fish Oil 1000 MG Capsule] 28 capsule 10    Sig: TAKE 1 CAPSULE BY MOUTH ONCE DAILY *TAKE WITH FOOD*     Off-Protocol Failed - 09/05/2022  3:21 PM      Failed - Medication not assigned to a protocol, review manually.      Passed - Valid encounter within last 12 months    Recent Outpatient Visits           4 weeks ago Schizoaffective disorder, unspecified type [facility])   [facility] Della Goo F, FNP   5 months ago Schizoaffective disorder, unspecified type [facility])   [facility] Berniece Salines, FNP   11 months ago Dysuria   [facility] Caro Laroche, DO   1 year ago Hospital discharge follow-up   [facility] Berniece Salines, FNP   1 year ago Schizoaffective disorder, unspecified type [facility])   [facility] Berniece Salines, FNP       Future Appointments             In 3 days Zane Herald Rudolpho Sevin, FNP [facility]   In 2 months  [facility]   In 5 months Zane Herald, Rudolpho Sevin, FNP [facility], [facility]           Endocrinology:  Nutritional Agents - omega-3 acid ethyl esters Failed - 09/05/2022  3:21 PM      Failed - Lipid Panel in normal range within the last 12 months    Cholesterol  Date Value Ref Range Status  08/08/2022 134 <200 mg/dL Final   LDL Cholesterol (Calc)  Date Value Ref Range Status  08/08/2022 75 mg/dL (calc) Final    Comment:    Reference range: <100 . Desirable range <100 mg/dL for primary prevention;   <70 mg/dL for patients  with CHD or diabetic patients  with > or = 2 CHD risk factors. Marland Kitchen LDL-C is now calculated using the Martin-Hopkins  calculation, which is a validated novel method providing  better accuracy than the Friedewald equation in the  estimation of LDL-C.  Horald Pollen et al. Lenox Ahr. 4098;119(14): 2061-2068  (http://education.[employer].com/faq/FAQ164)    HDL  Date Value Ref Range Status  08/08/2022 41 > OR = 40 mg/dL Final   Triglycerides  Date Value Ref Range Status  08/08/2022 96 <150 mg/dL Final         Passed - Valid encounter within last 12 months    Recent Outpatient Visits           4 weeks ago Schizoaffective disorder, unspecified type [facility])    [facility], FNP   5 months ago Schizoaffective disorder, unspecified type [facility])   [facility] Berniece Salines, FNP   11 months ago Dysuria   [facility] Caro Laroche, DO   1 year ago Hospital discharge follow-up   [facility] Berniece Salines, FNP   1 year ago Schizoaffective disorder, unspecified type (  [facility])   [facility] Berniece Salines, FNP       Future Appointments             In 3 days Zane Herald, Rudolpho Sevin, FNP [facility], [facility]   In 2 months  [facility], [place]   In 5 months Zane Herald, Rudolpho Sevin, FNP [facility]           Over the Counter:  OTC Passed - 09/05/2022  3:21 PM      Passed - Valid encounter within last 12 months    Recent Outpatient Visits           4 weeks ago Schizoaffective disorder, unspecified type [facility])   [facility] Berniece Salines, FNP   5 months ago Schizoaffective disorder, unspecified type [facility])   [facility] Berniece Salines, FNP   11 months ago Dysuria   [facility] Caro Laroche,  DO   1 year ago Hospital discharge follow-up   [facility] Bernie[facility] Salines, FNP   1 year ago Schizoaffective disorder, unspecified type [facility])   [facility] Berniece Salines, FNP       Future Appointments             In 3 days Zane Herald, Rudolpho Sevin, FNP [facility], [facility]   In 2 months  [facility]   In 5 months Zane Herald, Rudolpho Sevin, FNP [facility], [facility]             lubiprostone (AMITIZA) 24 MCG capsule [Pharmacy Med Name: Lubiprostone 24 MCG Capsule] 180 capsule 3    Sig: TAKE 1 CAPSULE BY MOUTH TWICE DAILY WITH MEALS     Gastroenterology: Irritable Bowel Syndrome - lubiprostone Passed - 09/05/2022  3:21 PM      Passed - AST in normal range and within 360 days    AST  Date Value Ref Range Status  08/08/2022 12 10 - 35 U/L Final         Passed - ALT in normal range and within 360 days    ALT  Date Value Ref Range Status  08/08/2022 10 9 - 46 U/L Final         Passed - Valid encounter within last 12 months    Recent Outpatient Visits           4 weeks ago Schizoaffective disorder, unspecified type [facility])   [facility] Berniece Salines, FNP   5 months ago Schizoaffective disorder, unspecified type [facility])   [facility] Berniece Salines, FNP   11 months ago Dysuria   [facility] Caro Laroche, DO   1 year ago Hospital discharge follow-up   [facility] Berniece Salines, FNP   1 year ago Schizoaffective disorder, unspecified type [facility])   [facility] Berniece Salines, FNP       Future Appointments             In 3 days Zane Herald, Rudolpho Sevin, FNP [facility], [facility]   In 2 months  [facility], [facility]   In 5 months Zane Herald, Rudolpho Sevin, FNP [facility],  [facility]

## 2022-09-09 ENCOUNTER — Encounter: Payer: Self-pay | Admitting: Nurse Practitioner

## 2022-09-09 ENCOUNTER — Ambulatory Visit (INDEPENDENT_AMBULATORY_CARE_PROVIDER_SITE_OTHER): Payer: Medicare Other | Admitting: Nurse Practitioner

## 2022-09-09 ENCOUNTER — Other Ambulatory Visit: Payer: Self-pay

## 2022-09-09 VITALS — BP 130/76 | HR 96 | Temp 97.8°F | Resp 18 | Ht 71.25 in | Wt 228.2 lb

## 2022-09-09 DIAGNOSIS — E785 Hyperlipidemia, unspecified: Secondary | ICD-10-CM

## 2022-09-09 DIAGNOSIS — Z0289 Encounter for other administrative examinations: Secondary | ICD-10-CM | POA: Diagnosis not present

## 2022-09-09 DIAGNOSIS — F259 Schizoaffective disorder, unspecified: Secondary | ICD-10-CM | POA: Diagnosis not present

## 2022-09-09 DIAGNOSIS — N400 Enlarged prostate without lower urinary tract symptoms: Secondary | ICD-10-CM

## 2022-09-09 DIAGNOSIS — Z0000 Encounter for general adult medical examination without abnormal findings: Secondary | ICD-10-CM

## 2022-09-09 NOTE — Patient Instructions (Signed)
Patient need Tdap, Shingrix and Pneumonia vaccine, colon cancer screening, AWV

## 2022-09-17 ENCOUNTER — Ambulatory Visit: Payer: Medicare Other | Admitting: Nurse Practitioner

## 2022-11-07 ENCOUNTER — Ambulatory Visit (INDEPENDENT_AMBULATORY_CARE_PROVIDER_SITE_OTHER): Payer: Medicare Other

## 2022-11-07 VITALS — Ht 71.25 in | Wt 228.0 lb

## 2022-11-07 DIAGNOSIS — Z0000 Encounter for general adult medical examination without abnormal findings: Secondary | ICD-10-CM

## 2022-11-07 NOTE — Progress Notes (Signed)
Subjective:   Tyrone Little is a 62 y.o. male who presents for Medicare Annual/Subsequent preventive examination.  Visit Complete: Virtual  I connected with  Tyrone Little on 11/07/22 by a audio enabled telemedicine application and verified that I am speaking with the correct person using two identifiers.  Patient Location: Home  Provider Location: Home Office  I discussed the limitations of evaluation and management by telemedicine. The patient expressed understanding and agreed to proceed.  Vital Signs: Unable to obtain new vitals due to this being a telehealth visit.  Patient Medicare AWV questionnaire was completed by the patient on (not done); I have confirmed that all information answered by patient is correct and no changes since this date.  Review of Systems    Cardiac Risk Factors include: advanced age (>62men, >47 women);dyslipidemia;male gender;obesity (BMI >30kg/m2)    Objective:    Today's Vitals   11/07/22 1143  Weight: 228 lb (103.4 kg)  Height: 5' 11.25" (1.81 m)   Body mass index is 31.58 kg/m.     11/07/2022   11:50 AM 09/18/2021    6:34 PM 08/25/2021    2:16 PM 09/22/2020   12:10 PM  Advanced Directives  Does Patient Have a Medical Advance Directive? Yes No No No  Type of Advance Directive Healthcare Power of Attorney       Current Medications (verified) Outpatient Encounter Medications as of 11/07/2022  Medication Sig   acetaminophen (TYLENOL) 500 MG tablet Take 2 tablets (1,000 mg total) by mouth every 8 (eight) hours as needed for fever (pain).   cetirizine (ZYRTEC) 10 MG tablet Take 1 tablet (10 mg total) by mouth daily.   Cholecalciferol (VITAMIN D3) 125 MCG (5000 UT) CAPS Take 1 capsule (5,000 Units total) by mouth daily.   fluPHENAZine (PROLIXIN) 5 MG tablet Take 5 mg by mouth at bedtime.   ibuprofen (ADVIL) 600 MG tablet Take 1 tablet (600 mg total) by mouth every 6 (six) hours as needed (pain).   Incontinence Supply Disposable (COMFORT SHIELD ADULT  DIAPERS) MISC 60 each by Does not apply route in the morning and at bedtime.   lubiprostone (AMITIZA) 24 MCG capsule TAKE 1 CAPSULE BY MOUTH TWICE DAILY WITH MEALS   Multiple Vitamin (THEREMS) TABS TAKE 1 TABLET BY MOUTH ONCE DAILY   omega-3 fish oil (MAXEPA) 1000 MG CAPS capsule TAKE 1 CAPSULE BY MOUTH ONCE DAILY *TAKE WITH FOOD*   OVER THE COUNTER MEDICATION Take 1 Scoop by mouth daily. Best Fiber Powder   pioglitazone (ACTOS) 15 MG tablet Take 1 tablet (15 mg total) by mouth daily.   QUEtiapine (SEROQUEL) 200 MG tablet Take 200 mg by mouth at bedtime.   risperiDONE (RISPERDAL) 2 MG tablet Take 2 mg by mouth 2 (two) times daily.   simvastatin (ZOCOR) 40 MG tablet Take 1 tablet (40 mg total) by mouth at bedtime.   tamsulosin (FLOMAX) 0.4 MG CAPS capsule TAKE 1 CAPSULE BY MOUTH ONCE DAILY *DO NOT CRUSH OR CHEW* **DO NOT OPEN CAPSULE**   divalproex (DEPAKOTE SPRINKLE) 125 MG capsule Take 4 capsules (500 mg total) by mouth every 12 (twelve) hours.   No facility-administered encounter medications on file as of 11/07/2022.    Allergies (verified) Patient has no known allergies.   History: Past Medical History:  Diagnosis Date   Schizotaxia    No past surgical history on file. No family history on file. Social History   Socioeconomic History   Marital status: Single    Spouse name: Not on file  Number of children: Not on file   Years of education: Not on file   Highest education level: Not on file  Occupational History   Not on file  Tobacco Use   Smoking status: Former    Current packs/day: 0.00    Types: Cigarettes    Quit date: 2007    Years since quitting: 17.6   Smokeless tobacco: Never  Vaping Use   Vaping status: Never Used  Substance and Sexual Activity   Alcohol use: Never   Drug use: Never   Sexual activity: Never  Other Topics Concern   Not on file  Social History Narrative   [facility] [address] [place], [place]  [phone]   Social Determinants  of Health   Financial Resource Strain: Low Risk  (09/09/2022)   Overall Financial Resource Strain (CARDIA)    Difficulty of Paying Living Expenses: Not hard at all  Food Insecurity: No Food Insecurity (09/09/2022)   Hunger Vital Sign    Worried About Running Out of Food in the Last Year: Never true    Ran Out of Food in the Last Year: Never true  Transportation Needs: No Transportation Needs (09/09/2022)   PRAPARE - Administrator, Civil Service (Medical): No    Lack of Transportation (Non-Medical): No  Physical Activity: Sufficiently Active (11/07/2022)   Exercise Vital Sign    Days of Exercise per Week: 5 days    Minutes of Exercise per Session: 30 min  Recent Concern: Physical Activity - Inactive (09/09/2022)   Exercise Vital Sign    Days of Exercise per Week: 0 days    Minutes of Exercise per Session: 0 min  Stress: No Stress Concern Present (09/09/2022)   [employer] of Occupational Health - Occupational Stress Questionnaire    Feeling of Stress : Only a little  Social Connections: Socially Isolated (09/09/2022)   Social Connection and Isolation Panel [NHANES]    Frequency of Communication with Friends and Family: Twice a week    Frequency of Social Gatherings with Friends and Family: Once a week    Attends Religious Services: Never    Database administrator or Organizations: No    Attends Engineer, structural: Never    Marital Status: Never married    Tobacco Counseling Counseling given: Not Answered   Clinical Intake:  Pre-visit preparation completed: No  Pain : No/denies pain     BMI - recorded: 31.58 Nutritional Status: BMI > 30  Obese Nutritional Risks: None Diabetes: No  How often do you need to have someone help you when you read instructions, pamphlets, or other written materials from your doctor or pharmacy?: 1 - Never  Interpreter Needed?: No  Comments: lives in group home Information entered by :: B.Tyrone Luera,LPN   Activities  of Daily Living    11/07/2022   11:50 AM 09/09/2022    2:34 PM  In your present state of health, do you have any difficulty performing the following activities:  Hearing? 0 0  Vision? 0 0  Difficulty concentrating or making decisions? 1 0  Walking or climbing stairs? 0 0  Dressing or bathing? 0 0  Doing errands, shopping? 1 1  Preparing Food and eating ? N   Using the Toilet? N   In the past six months, have you accidently leaked urine? N   Do you have problems with loss of bowel control? Y   Managing your Medications? Y   Managing your Finances? Jeannie Fend  Housekeeping or managing your Housekeeping? Y     Patient Care Team: Berniece Salines, FNP as PCP - General (Nurse Practitioner)  Indicate any recent Medical Services you may have received from other than [facility] providers in the past year (date may be approximate).     Assessment:   This is a routine wellness examination for [place].  Hearing/Vision screen Hearing Screening - Comments:: Hearing adequate Vision Screening - Comments:: Adequate vision [facility]  Dietary issues and exercise activities discussed:     Goals Addressed   None    Depression Screen    09/09/2022    2:34 PM 08/08/2022    1:22 PM 03/20/2022    1:27 PM 09/19/2021    1:05 PM 08/07/2021    3:27 PM 05/09/2021   11:26 AM 05/09/2021   10:54 AM  PHQ 2/9 Scores  PHQ - 2 Score 0 0 0 0 0 0 0  PHQ- 9 Score  0    0     Fall Risk    11/07/2022   11:47 AM 09/09/2022    2:33 PM 08/08/2022    1:22 PM 03/20/2022    1:27 PM 09/19/2021    1:04 PM  Fall Risk   Falls in the past year? 0 0 0 0 0  Number falls in past yr: 0 0 0 0 0  Injury with Fall? 0 0 0 0 0  Risk for fall due to : No Fall Risks      Follow up Education provided;Falls prevention discussed   Falls evaluation completed Falls evaluation completed    MEDICARE RISK AT HOME:  Medicare Risk at Home - 11/07/22 1147     Any stairs in or around the home? Yes    If so, are there any without handrails? Yes     Home free of loose throw rugs in walkways, pet beds, electrical cords, etc? Yes    Adequate lighting in your home to reduce risk of falls? Yes    Life alert? No    Use of a cane, walker or w/c? No    Grab bars in the bathroom? Yes    Shower chair or bench in shower? Yes    Elevated toilet seat or a handicapped toilet? No             TIMED UP AND GO:  Was the test performed?  No    Cognitive Function: Pt left for day program: unable to do       Immunizations Immunization History  Administered Date(s) Administered   Influenza,inj,Quad PF,6+ Mos 03/20/2022   Influenza-Unspecified 11/10/2020    TDAP status: Due, Education has been provided regarding the importance of this vaccine. Advised may receive this vaccine at local pharmacy or Health Dept. Aware to provide a copy of the vaccination record if obtained from local pharmacy or Health Dept. Verbalized acceptance and understanding.  Flu Vaccine status: Up to date  Pneumococcal vaccine status: Due, Education has been provided regarding the importance of this vaccine. Advised may receive this vaccine at local pharmacy or Health Dept. Aware to provide a copy of the vaccination record if obtained from local pharmacy or Health Dept. Verbalized acceptance and understanding.  Covid-19 vaccine status: Completed vaccines  Qualifies for Shingles Vaccine? Yes   Zostavax completed No   Shingrix Completed?: No.    Education has been provided regarding the importance of this vaccine. Patient has been advised to call insurance company to determine out of pocket expense if they have  not yet received this vaccine. Advised may also receive vaccine at local pharmacy or Health Dept. Verbalized acceptance and understanding.  Screening Tests Health Maintenance  Topic Date Due   COVID-19 Vaccine (1) Never done   DTaP/Tdap/Td (1 - Tdap) Never done   Zoster Vaccines- Shingrix (1 of 2) Never done   Colonoscopy  Never done   INFLUENZA VACCINE   10/31/2022   Medicare Annual Wellness (AWV)  11/07/2023   Hepatitis C Screening  Completed   HIV Screening  Completed   HPV VACCINES  Aged Out    Health Maintenance  Health Maintenance Due  Topic Date Due   COVID-19 Vaccine (1) Never done   DTaP/Tdap/Td (1 - Tdap) Never done   Zoster Vaccines- Shingrix (1 of 2) Never done   Colonoscopy  Never done   INFLUENZA VACCINE  10/31/2022    Colorectal cancer screening: Type of screening: Colonoscopy. Completed no. Repeat every 5-10 years  Lung Cancer Screening: (Low Dose CT Chest recommended if Age 70-80 years, 20 pack-year currently smoking OR have quit w/in 15years.)  does notqualify.   Lung Cancer Screening Referral:    Additional Screening:  Hepatitis C Screening: does not qualify; Completed yes  Vision Screening: Recommended annual ophthalmology exams for early detection of glaucoma and other disorders of the eye.yes Is the patient up to date with their annual eye exam?  Yes  Who is the provider or what is the name of the office in which the patient attends annual eye exams? [facility] If pt is not established with a provider, would they like to be referred to a provider to establish care? No .   Dental Screening: Recommended annual dental exams for proper oral hygiene  Diabetic Foot Exam: n/a  Community Resource Referral / Chronic Care Management: CRR required this visit?  No   CCM required this visit?  No     Plan:     I have personally reviewed and noted the following in the patient's chart:   Medical and social history Use of alcohol, tobacco or illicit drugs  Current medications and supplements including opioid prescriptions. Patient is not currently taking opioid prescriptions. Functional ability and status Nutritional status Physical activity Advanced directives List of other physicians Hospitalizations, surgeries, and ER visits in previous 12 months Vitals Screenings to include cognitive, depression,  and falls Referrals and appointments  In addition, I have reviewed and discussed with patient certain preventive protocols, quality metrics, and best practice recommendations. A written personalized care plan for preventive services as well as general preventive health recommendations were provided to patient.     Sue Lush, LPN   06/01/4399   After Visit Summary: (Declined) Due to this being a telephonic visit, with patients personalized plan was offered to patient but patient Declined AVS at this time   Nurse Notes: I spoke with pt's worker , Neysa Bonito at the Group home. Pt leaving for day program. She relays pt is doing alright. The only problem she notes is he is incontinent with his bowels. GI referral place in May. Neysa Bonito says she thinks (not sure) he had the appt but no results in chart.

## 2022-11-14 ENCOUNTER — Ambulatory Visit: Payer: Medicare Other

## 2023-02-10 ENCOUNTER — Ambulatory Visit: Payer: Medicare Other | Admitting: Nurse Practitioner

## 2023-02-17 ENCOUNTER — Other Ambulatory Visit: Payer: Self-pay | Admitting: Nurse Practitioner

## 2023-02-17 DIAGNOSIS — N400 Enlarged prostate without lower urinary tract symptoms: Secondary | ICD-10-CM

## 2023-02-17 DIAGNOSIS — E559 Vitamin D deficiency, unspecified: Secondary | ICD-10-CM

## 2023-02-17 DIAGNOSIS — J301 Allergic rhinitis due to pollen: Secondary | ICD-10-CM

## 2023-02-17 DIAGNOSIS — E785 Hyperlipidemia, unspecified: Secondary | ICD-10-CM

## 2023-02-18 NOTE — Telephone Encounter (Signed)
Requested medication (s) are due for refill today: Yes  Requested medication (s) are on the active medication list: Yes  Last refill:  08/08/22  Future visit scheduled:   Notes to clinic:  Manual review.    Requested Prescriptions  Pending Prescriptions Disp Refills   Cholecalciferol (VITAMIN D3) 125 MCG (5000 UT) CAPS [Pharmacy Med Name: Vitamin D3 125 MCG (5000 UT) Capsule] 6 capsule 10    Sig: TAKE 1 CAPSULE BY MOUTH ONCE DAILY *TAKE WITH FOOD*     Endocrinology:  Vitamins - Vitamin D Supplementation 2 Failed - 02/17/2023 12:54 PM      Failed - Manual Review: Route requests for 50,000 IU strength to the provider      Passed - Ca in normal range and within 360 days    Calcium  Date Value Ref Range Status  08/08/2022 8.8 8.6 - 10.3 mg/dL Final   Calcium, Ion  Date Value Ref Range Status  08/02/2021 1.24 1.15 - 1.40 mmol/L Final         Passed - Vitamin D in normal range and within 360 days    Vit D, 25-Hydroxy  Date Value Ref Range Status  03/20/2022 69 30 - 100 ng/mL Final    Comment:    Vitamin D Status         25-OH Vitamin D: . Deficiency:                    <20 ng/mL Insufficiency:             20 - 29 ng/mL Optimal:                 > or = 30 ng/mL . For 25-OH Vitamin D testing on patients on  D2-supplementation and patients for whom quantitation  of D2 and D3 fractions is required, the QuestAssureD(TM) 25-OH VIT D, (D2,D3), LC/MS/MS is recommended: order  code 55732 (patients >25yrs). . See Note 1 . Note 1 . For additional information, please refer to  http://education.QuestDiagnostics.com/faq/FAQ199  (This link is being provided for informational/ educational purposes only.)          Passed - Valid encounter within last 12 months    Recent Outpatient Visits           5 months ago Hyperlipidemia, unspecified hyperlipidemia type   [facility] Della Goo F, FNP   6 months ago Schizoaffective disorder, unspecified type  [facility])   [facility] Berniece Salines, FNP   11 months ago Schizoaffective disorder, unspecified type [facility])   [facility] Berniece Salines, FNP   1 year ago Dysuria   [facility] Caro Laroche, DO   1 year ago Hospital discharge follow-up   [facility] Berniece Salines, FNP       Future Appointments             In 2 weeks Berniece Salines, FNP [facility]            Signed Prescriptions Disp Refills   simvastatin (ZOCOR) 40 MG tablet 30 tablet 6    Sig: TAKE 1 TABLET BY MOUTH EVERY NIGHT AT BEDTIME     Cardiovascular:  Antilipid - Statins Failed - 02/17/2023 12:54 PM      Failed - Lipid Panel in normal range within the last 12 months    Cholesterol  Date Value Ref Range Status  08/08/2022 134 <200 mg/dL Final   LDL Cholesterol (Calc)  Date Value Ref Range Status  08/08/2022 75 mg/dL (calc) Final    Comment:    Reference range: <100 . Desirable range <100 mg/dL for primary prevention;   <70 mg/dL for patients with CHD or diabetic patients  with > or = 2 CHD risk factors. Marland Kitchen LDL-C is now calculated using the Martin-Hopkins  calculation, which is a validated novel method providing  better accuracy than the Friedewald equation in the  estimation of LDL-C.  Horald Pollen et al. Lenox Ahr. 1914;782(95): 2061-2068  (http://education.Quest[facility].com/faq/FAQ164)    HDL  Date Value Ref Range Status  08/08/2022 41 > OR = 40 mg/dL Final   Triglycerides  Date Value Ref Range Status  08/08/2022 96 <150 mg/dL Final         Passed - Patient is not pregnant      Passed - Valid encounter within last 12 months    Recent Outpatient Visits           5 months ago Hyperlipidemia, unspecified hyperlipidemia type   [facility] Della [facility]oo F, FNP   6 months ago Schizoaffective disorder, unspecified type [facility])    [facility] Berniece Salines, FNP   11 months ago Schizoaffective disorder, unspecified type [facility])   [facility] Berniece Salines, FNP   1 year ago Dysuria   [facility] Caro Laroche, DO   1 year ago Hospital discharge follow-up   [facility] Berniece Salines, FNP       Future Appointments             In 2 weeks Zane Herald Rudolpho Sevin, FNP [facility]             pioglitazone (ACTOS) 15 MG tablet 30 tablet 6    Sig: TAKE 1 TABLET BY MOUTH ONCE DAILY     Endocrinology:  Diabetes - Glitazones - pioglitazone Failed - 02/17/2023 12:54 PM      Failed - HBA1C is between 0 and 7.9 and within 180 days    Hgb A1c MFr Bld  Date Value Ref Range Status  08/08/2022 5.4 <5.7 % of total Hgb Final    Comment:    For the purpose of screening for the presence of diabetes: . <5.7%       Consistent with the absence of diabetes 5.7-6.4%    Consistent with increased risk for diabetes             (prediabetes) > or =6.5%  Consistent with diabetes . This assay result is consistent with a decreased risk of diabetes. . Currently, no consensus exists regarding use of hemoglobin A1c for diagnosis of diabetes in children. . According to American Diabetes Association (ADA) guidelines, hemoglobin A1c <7.0% represents optimal control in non-pregnant diabetic patients. Different metrics may apply to specific patient populations.  Standards of Medical Care in Diabetes(ADA). Verna Czech - Valid encounter within last 6 months    Recent Outpatient Visits           5 months ago Hyperlipidemia, unspecified hyperlipidemia type   [facility] Bernie[facility] Salines, FNP   6 months ago Schizoaffective disorder, unspecified type [facility])   [facility] Berniece Salines, FNP   11 months ago Schizoaffective disorder,  unspecified type (HCC)  [facility] Berniece Salines, FNP   1 year ago Dysuria   [facility] Caro Laroche, DO   1 year ago Hospital discharge follow-up   [place] Berniece Salines, FNP       Future Appointments             In 2 weeks Zane Herald, Rudolpho Sevin, FNP [facility], [facility]             tamsulosin (FLOMAX) 0.4 MG CAPS capsule 30 capsule 6    Sig: TAKE 1 CAPSULE BY MOUTH ONCE DAILY  *DO NOT CRUSH OR CHEW* **DO NOT OPEN CAPSULE**     Urology: Alpha-Adrenergic Blocker Failed - 02/17/2023 12:54 PM      Failed - PSA in normal range and within 360 days    No results found for: "LABPSA", "PSA", "PSA1", "ULTRAPSA"       Passed - Last BP in normal range    BP Readings from Last 1 Encounters:  09/09/22 130/76         Passed - Valid encounter within last 12 months    Recent Outpatient Visits           5 months ago Hyperlipidemia, unspecified hyperlipidemia type   [facility] Bernie[facility] Salines, FNP   6 months ago Schizoaffective disorder, unspecified type [facility])   [facility] Bernie[facility] Salines, FNP   11 months ago Schizoaffective disorder, unspecified type [facility])   [facility] Bernie[facility] Salines, F[facility]   1 year ago Dysuria   [facility] Caro Laroche, DO   1 year ago Hospital discharge follow-up   [facility] Berniece Salines, FNP       Future Appointments             In 2 weeks Zane Herald, Rudolpho Sevin, FNP [facility], [facility]             cetirizine (ZYRTEC) 10 MG tablet 30 tablet 6    Sig: TAKE 1 TABLET BY MOUTH ONCE DAILY     Ear, Nose, and Throat:  Antihistamines 2 Passed - 02/17/2023 12:54 PM      Passed - Cr in normal range and within 360 days    Creat  Date Value Ref Range Status  08/08/2022 1.01 0.70 -  1.35 mg/dL Final         Passed - Valid encounter within last 12 months    Recent Outpatient Visits           5 months ago Hyperlipidemia, unspecified hyperlipidemia type   [facility] Berniece Salines, FNP   6 months ago Schizoaffective disorder, unspecified type [facility])   [facility] Berniece Salines, FNP   11 months ago Schizoaffective disorder, unspecified type [facility])   [facility] Berniece Salines, FNP   1 year ago Dysuria   [facility] Caro Laroche, DO   1 year ago Hospital discharge follow-up   [facility] Berniece Salines, FNP       Future Appointments             In 2 weeks Zane Herald, Rudolpho Sevin, FNP [facility], [facility]

## 2023-02-18 NOTE — Telephone Encounter (Signed)
Requested Prescriptions  Pending Prescriptions Disp Refills   simvastatin (ZOCOR) 40 MG tablet [Pharmacy Med Name: Simvastatin 40 MG Tablet] 30 tablet 10    Sig: TAKE 1 TABLET BY MOUTH EVERY NIGHT AT BEDTIME     Cardiovascular:  Antilipid - Statins Failed - 02/17/2023 12:54 PM      Failed - Lipid Panel in normal range within the last 12 months    Cholesterol  Date Value Ref Range Status  08/08/2022 134 <200 mg/dL Final   LDL Cholesterol (Calc)  Date Value Ref Range Status  08/08/2022 75 mg/dL (calc) Final    Comment:    Reference range: <100 . Desirable range <100 mg/dL for primary prevention;   <70 mg/dL for patients with CHD or diabetic patients  with > or = 2 CHD risk factors. Marland Kitchen LDL-C is now calculated using the Martin-Hopkins  calculation, which is a validated novel method providing  better accuracy than the Friedewald equation in the  estimation of LDL-C.  Horald Pollen et al. Lenox Ahr. 7841;282(08): 2061-2068  (http://education.QuestDiagn[facility].com/faq/FAQ164)    HDL  Date Value Ref Range Status  08/08/2022 41 > OR = 40 mg/dL Final   Triglycerides  Date Value Ref Range Status  08/08/2022 96 <150 mg/dL Final         Passed - Patient is not pregnant      Passed - Valid encounter within last 12 months    Recent Outpatient Visits           5 months ago Hyperlipidemia, unspecified hyperlipidemia type   [facility] Della [facility]oo F, FNP   6 months ago Schizoaffective disorder, unspecified type [facility])   [facility] Berniece Salines, FNP   11 months ago Schizoaffective disorder, unspecified type [facility])   [facility] Berniece Salines, FNP   1 year ago Dysuria   [facility] Caro Laroche, DO   1 year ago Hospital discharge follow-up   [facility] Bernie[facility] Salines, FNP       Future Appointments             In 2 weeks Zane Herald, Rudolpho Sevin, FNP  [facility]             pioglitazone (ACTOS) 15 MG tablet [Pharmacy Med Name: Pioglitazone HCl 15 MG Tablet] 30 tablet 10    Sig: TAKE 1 TABLET BY MOUTH ONCE DAILY     Endocrinology:  Diabetes - Glitazones - pioglitazone Failed - 02/17/2023 12:54 PM      Failed - HBA1C is between 0 and 7.9 and within 180 days    Hgb A1c MFr Bld  Date Value Ref Range Status  08/08/2022 5.4 <5.7 % of total Hgb Final    Comment:    For the purpose of screening for the presence of diabetes: . <5.7%       Consistent with the absence of diabetes 5.7-6.4%    Consistent with increased risk for diabetes             (prediabetes) > or =6.5%  Consistent with diabetes . This assay result is consistent with a decreased risk of diabetes. . Currently, no consensus exists regarding use of hemoglobin A1c for diagnosis of diabetes in children. . According to American Diabetes Association (ADA) guidelines, hemoglobin A1c <7.0% represents optimal control in non-pregnant diabetic patients. Different metrics may apply to specific patient populations.  Standards of Medical Care  in Diabetes(ADA). Verna Czech - Valid encounter within last 6 months    Recent Outpatient Visits           5 months ago Hyperlipidemia, unspecified hyperlipidemia type   [facility] D[facility] F, FNP   6 months ago Schizoaffective disorder, unspecified type [facility])   [facility] Berniece Salines, FNP   11 months ago Schizoaffective disorder, unspecified type [facility])   [facility] Berniece Salines, FNP   1 year ago Dysuria   [facility] Car[facility], DO   1 year ago Hospital discharge follow-up   [facility] Berniece Salines, FNP       Future Appointments             In 2 weeks Zane Herald, Rudolpho Sevin, FNP [facility]              tamsulosin (FLOMAX) 0.4 MG CAPS capsule [Pharmacy Med Name: Tamsulosin HCl 0.4 MG Capsule] 30 capsule 10    Sig: TAKE 1 CAPSULE BY MOUTH ONCE DAILY  *DO NOT CRUSH OR CHEW* **DO NOT OPEN CAPSULE**     Urology: Alpha-Adrenergic Blocker Failed - 02/17/2023 12:54 PM      Failed - PSA in normal range and within 360 days    No results found for: "LABPSA", "PSA", "PSA1", "ULTRAPSA"       Passed - Last BP in normal range    BP Readings from Last 1 Encounters:  09/09/22 130/76         Passed - Valid encounter within last 12 months    Recent Outpatient Visits           5 months ago Hyperlipidemia, unspecified hyperlipidemia type   [facility] Salines, FNP   6 months ago Schizoaffective disorder, unspecified type [facility])   [facility] Berniece Salines, FNP   11 months ago Schizoaffective disorder, unspecified type [facility])   [facility] Berniece Salines, FNP   1 year ago Dysuria   [facility] Caro Laroche, DO   1 year ago Hospital discharge follow-up   [facility] Berniece Salines, FNP       Future Appointments             In 2 weeks Zane Herald, Rudolpho Sevin, FNP [facility], [facility]             cetirizine (ZYRTEC) 10 MG tablet [Pharmacy Med Name: Cetirizine HCl 10 MG Tablet] 30 tablet 10    Sig: TAKE 1 TABLET BY MOUTH ONCE DAILY     Ear, Nose, and Throat:  Antihistamines 2 Passed - 02/17/2023 12:54 PM      Passed - Cr in normal range and within 360 days    Creat  Date Value Ref Range Status  08/08/2022 1.01 0.70 - 1.35 mg/dL Final         Passed - Valid encounter within last 12 months    Recent Outpatient Visits           5 months ago Hyperlipidemia, unspecified hyperlipidemia type   [facility] Berniece Salines, FNP   6 months ago Schizoaffective disorder, unspecified type [facility])    [facility] Berniece Salines, [place]  11 months ago Schizoaffective disorder, unspecified type [facility])   [facility] Berniece Salines, FNP   1 year ago Dysuria   [facility] Car[facility], DO   1 year ago Hospital discharge follow-up   [facility] Bernie[facility] Salines, FNP       Future Appointments             In 2 weeks Zane Herald, Rudolpho Sevin, FNP [facility]             Cholecalciferol (VITAMIN D3) 125 MCG (5000 UT) CAPS [Pharmacy Med Name: Vitamin D3 125 MCG (5000 UT) Capsule] 6 capsule 10    Sig: TAKE 1 CAPSULE BY MOUTH ONCE DAILY *TAKE WITH FOOD*     Endocrinology:  Vitamins - Vitamin D Supplementation 2 Failed - 02/17/2023 12:54 PM      Failed - Manual Review: Route requests for 50,000 IU strength to the provider      Passed - Ca in normal range and within 360 days    Calcium  Date Value Ref Range Status  08/08/2022 8.8 8.6 - 10.3 mg/dL Final   Calcium, Ion  Date Value Ref Range Status  08/02/2021 1.24 1.15 - 1.40 mmol/L Final         Passed - Vitamin D in normal range and within 360 days    Vit D, 25-Hydroxy  Date Value Ref Range Status  03/20/2022 69 30 - 100 ng/mL Final    Comment:    Vitamin D Status         25-OH Vitamin D: . Deficiency:                    <20 ng/mL Insufficiency:             20 - 29 ng/mL Optimal:                 > or = 30 ng/mL . For 25-OH Vitamin D testing on patients on  D2-supplementation and patients for whom quantitation  of D2 and D3 fractions is required, the QuestAssureD(TM) 25-OH VIT D, (D2,D3), LC/MS/MS is recommended: order  code 95621 (patients >93yrs). . See Note 1 . Note 1 . For additional information, please refer to  http://education.QuestDiagn[facility].com/faq/FAQ199  (This link is being provided for informational/ educational purposes only.)          Passed - Valid encounter within  last 12 months    Recent Outpatient Visits           5 months ago Hyperlipidemia, unspecified hyperlipidemia type   [facility] Bernie[facility] Salines, FNP   6 months ago Schizoaffective disorder, unspecified type [facility])   [facility] Berniece Salines, FNP   11 months ago Schizoaffective disorder, unspecified type [facility])   [facility] Berniece Salines, FNP   1 year ago Dysuria   [facility] Caro Laroche, DO   1 year ago Hospital discharge follow-up   [facility] Berniece Salines, FNP       Future Appointments             In 2 weeks Zane Herald, Rudolpho Sevin, FNP [facility], [facility]

## 2023-02-19 ENCOUNTER — Ambulatory Visit: Payer: Medicare Other | Admitting: Nurse Practitioner

## 2023-03-05 ENCOUNTER — Ambulatory Visit: Payer: Medicare Other | Admitting: Nurse Practitioner

## 2023-03-05 NOTE — Progress Notes (Unsigned)
There were no vitals taken for this visit.   Subjective:    Patient ID: Tyrone Little, male    DOB: Jan 27, 1961, 62 y.o.   MRN: [id number]  HPI: Tyrone Little is a 62 y.o. male  No chief complaint on file.   Schizoaffective disorder: he is under the care of [facility] in [place].  He currently takes seroquel 200 mg at bedtime, fluphenazine 5 mg at bedtime, risperidone 2 mg BID and hydroxyzine 50 mg TID as needed. Patient reports he is doing well. Patient is currently on actos due to his mental health medication. Refill sent.      09/09/2022    2:34 PM 08/08/2022    1:22 PM 03/20/2022    1:27 PM 09/19/2021    1:05 PM 08/07/2021    3:27 PM  Depression screen PHQ 2/9  Decreased Interest 0 0 0 0 0  Down, Depressed, Hopeless 0 0 0 0 0  PHQ - 2 Score 0 0 0 0 0  Altered sleeping  0     Tired, decreased energy  0     Change in appetite  0     Feeling bad or failure about yourself   0     Trouble concentrating  0     Moving slowly or fidgety/restless  0     Suicidal thoughts  0     PHQ-9 Score  0     Difficult doing work/chores  Not difficult at all       Hyperlipidemia: his last LDL was 79 on 03/20/2022.  He currently takes simvastatin 40 mg daily.  He denies any myalgia.  Refills sent.  The 10-year ASCVD risk score (Arnett DK, et al., 2019) is: 5%   Values used to calculate the score:     Age: 74 years     Sex: Male     Is Non-Hispanic African American: No     Diabetic: No     Tobacco smoker: No     Systolic Blood Pressure: 106 mmHg     Is BP treated: No     HDL Cholesterol: 41 mg/dL     Total Cholesterol: 134 mg/dL    Vitamin D deficiency: he is currently taking vitamin d supplement.  Last vitamin d level was 69 03/20/2022.   Chronic constipation: he does see GI,  last seen on 05/28/2021. He currently takes amitiza 24 mcg two times a day, miralax and stool softener.  Patient has also reached out to gi because patient is having stool incontinence.  He recommended that he  do miralax 17 grams twice daily and trilyte prep. Patient is due for colonoscopy, recommend follow up with GI. Has not followed up with GI yet.  Placed a referral and provided information to [facility] group home.    Seizure like activity: patient was seen at the er in May for altered mental status and they suspected he had a seizure, started him on Depakote.  Patient denies any recent episodes. Patient was supposed to see neurology.  Patient had appointment with neurology on 07/09/2022. Plan is : - Schedule routine and 3-hour EEG to evaluate seizure-like activity-Check labs today: Depakote level CBC with differential, CMP. -Continue Depakote 500 mg sprinkle twice daily.- - Discussed seizure precautions with patient, including: - No driving for 6 months after most recent seizure or spell, per [place] law. - Avoid activities that are potentially dangerous if you were to have another seizure, including operating heavy machinery, swimming, taking  baths, climbing heights. Please use direct supervision around stoves, ovens, fireplaces, campfires, or other sources of heat or fire.  - Make sure you are taking medications as directed, avoiding alcohol and drug use, getting adequate sleep and eating regular meals.   Follow up after EEG.   BPH:  currently takes flomax 0.4 mg daily. He reports he occasionally has some incontinence but otherwise is doing well.   Relevant past medical, surgical, family and social history reviewed and updated as indicated. Interim medical history since our last visit reviewed. Allergies and medications reviewed and updated.  Review of Systems  Constitutional: Negative for fever or weight change.  Respiratory: Negative for cough and shortness of breath.   Cardiovascular: Negative for chest pain or palpitations.  Gastrointestinal: Negative for abdominal pain, no bowel changes.  Musculoskeletal: Negative for gait problem or joint swelling.  Skin: Negative for rash.   Neurological: Negative for dizziness or headache.  No other specific complaints in a complete review of systems (except as listed in HPI above).      Objective:    There were no vitals taken for this visit.  Wt Readings from Last 3 Encounters:  11/07/22 228 lb (103.4 kg)  09/09/22 228 lb 3.2 oz (103.5 kg)  08/08/22 230 lb 4.8 oz (104.5 kg)    Physical Exam  Constitutional: Patient appears well-developed and well-nourished. Obese  No distress.  HEENT: head atraumatic, normocephalic, pupils equal and reactive to light, neck supple Cardiovascular: Normal rate, regular rhythm and normal heart sounds.  No murmur heard. No BLE edema. Pulmonary/Chest: Effort normal and breath sounds normal. No respiratory distress. Abdominal: Soft.  There is no tenderness. Neuro: equal grip, no arm drift Psychiatric: Patient has a normal mood and affect. behavior is normal. Judgment and thought content normal.  Results for orders placed or performed in visit on 08/08/22  CBC with Differential/Platelet  Result Value Ref Range   WBC 4.5 3.8 - 10.8 Thousand/uL   RBC 4.81 4.20 - 5.80 Million/uL   Hemoglobin 14.0 13.2 - 17.1 g/dL   HCT 95.2 84.1 - 32.4 %   MCV 86.5 80.0 - 100.0 fL   MCH 29.1 27.0 - 33.0 pg   MCHC 33.7 32.0 - 36.0 g/dL   RDW 40.1 02.7 - 25.3 %   Platelets 210 140 - 400 Thousand/uL   MPV 10.4 7.5 - 12.5 fL   Neutro Abs 2,624 1,500 - 7,800 cells/uL   Lymphs Abs 1,485 850 - 3,900 cells/uL   Absolute Monocytes 320 200 - 950 cells/uL   Eosinophils Absolute 41 15 - 500 cells/uL   Basophils Absolute 32 0 - 200 cells/uL   Neutrophils Relative % 58.3 %   Total Lymphocyte 33.0 %   Monocytes Relative 7.1 %   Eosinophils Relative 0.9 %   Basophils Relative 0.7 %  COMPLETE METABOLIC PANEL WITH GFR  Result Value Ref Range   Glucose, Bld 83 65 - 99 mg/dL   BUN 21 7 - 25 mg/dL   Creat 6.64 4.03 - 4.74 mg/dL   eGFR 85 > OR = 60 QV/ZDG/3.87F6   BUN/Creatinine Ratio SEE NOTE: 6 - 22 (calc)    Sodium 143 135 - 146 mmol/L   Potassium 4.4 3.5 - 5.3 mmol/L   Chloride 105 98 - 110 mmol/L   CO2 31 20 - 32 mmol/L   Calcium 8.8 8.6 - 10.3 mg/dL   Total Protein 6.1 6.1 - 8.1 g/dL   Albumin 3.8 3.6 - 5.1 g/dL   Globulin 2.3 1.9 -  3.7 g/dL (calc)   AG Ratio 1.7 1.0 - 2.5 (calc)   Total Bilirubin 0.4 0.2 - 1.2 mg/dL   Alkaline phosphatase (APISO) 45 35 - 144 U/L   AST 12 10 - 35 U/L   ALT 10 9 - 46 U/L  Hemoglobin A1c  Result Value Ref Range   Hgb A1c MFr Bld 5.4 <5.7 % of total Hgb   Mean Plasma Glucose 108 mg/dL   eAG (mmol/L) 6.0 mmol/L  Hepatitis C antibody  Result Value Ref Range   Hepatitis C Ab NON-REACTIVE NON-REACTIVE  Lipid panel  Result Value Ref Range   Cholesterol 134 <200 mg/dL   HDL 41 > OR = 40 mg/dL   Triglycerides 96 <865 mg/dL   LDL Cholesterol (Calc) 75 mg/dL (calc)   Total CHOL/HDL Ratio 3.3 <5.0 (calc)   Non-HDL Cholesterol (Calc) 93 <784 mg/dL (calc)      Assessment & Plan:   Problem List Items Addressed This Visit   None     Follow up plan: No follow-ups on file.

## 2023-04-15 ENCOUNTER — Ambulatory Visit: Payer: Medicare Other | Admitting: Nurse Practitioner

## 2023-05-05 ENCOUNTER — Encounter: Payer: Self-pay | Admitting: Nurse Practitioner

## 2023-05-05 ENCOUNTER — Ambulatory Visit (INDEPENDENT_AMBULATORY_CARE_PROVIDER_SITE_OTHER): Payer: 59 | Admitting: Nurse Practitioner

## 2023-05-05 VITALS — BP 124/72 | HR 91 | Temp 97.8°F | Resp 16 | Ht 71.25 in | Wt 221.0 lb

## 2023-05-05 DIAGNOSIS — K5909 Other constipation: Secondary | ICD-10-CM

## 2023-05-05 DIAGNOSIS — Z131 Encounter for screening for diabetes mellitus: Secondary | ICD-10-CM

## 2023-05-05 DIAGNOSIS — Z130 Encounter for screening for diseases of the blood and blood-forming organs and certain disorders involving the immune mechanism: Secondary | ICD-10-CM | POA: Diagnosis not present

## 2023-05-05 DIAGNOSIS — E785 Hyperlipidemia, unspecified: Secondary | ICD-10-CM

## 2023-05-05 DIAGNOSIS — E876 Hypokalemia: Secondary | ICD-10-CM

## 2023-05-05 DIAGNOSIS — Z23 Encounter for immunization: Secondary | ICD-10-CM | POA: Diagnosis not present

## 2023-05-05 DIAGNOSIS — F259 Schizoaffective disorder, unspecified: Secondary | ICD-10-CM | POA: Diagnosis not present

## 2023-05-05 DIAGNOSIS — N400 Enlarged prostate without lower urinary tract symptoms: Secondary | ICD-10-CM

## 2023-05-05 DIAGNOSIS — R569 Unspecified convulsions: Secondary | ICD-10-CM | POA: Diagnosis not present

## 2023-05-05 DIAGNOSIS — K581 Irritable bowel syndrome with constipation: Secondary | ICD-10-CM

## 2023-05-05 DIAGNOSIS — Z6835 Body mass index (BMI) 35.0-35.9, adult: Secondary | ICD-10-CM

## 2023-05-05 NOTE — Assessment & Plan Note (Signed)
Comorbidities include BPH, IBS, HLD Continue working on lifestyle modification

## 2023-05-05 NOTE — Assessment & Plan Note (Addendum)
Labs ordered, test results pending, will review and follow-up

## 2023-05-05 NOTE — Assessment & Plan Note (Addendum)
Labs ordered, test results pending, will review and follow-up. Currently taking simvastatin 40 mg daily

## 2023-05-05 NOTE — Assessment & Plan Note (Addendum)
Managed by psychiatry . Currently taking risperdal 2 mg BID, seroquel 200 mg at bedtime.

## 2023-05-05 NOTE — Assessment & Plan Note (Addendum)
Will continue current regimen (miralax daily) and follow up in 6 months. Patient tolerating well.  Uses adult briefs daily.

## 2023-05-05 NOTE — Progress Notes (Signed)
BP 124/72   Pulse 91   Temp 97.8 F (36.6 C)   Resp 16   Ht 5' 11.25" (1.81 m)   Wt 221 lb (100.2 kg)   SpO2 97%   BMI 30.61 kg/m    Subjective:    Patient ID: Tyrone Little, male    DOB: 01-06-1961, 63 y.o.   MRN: [id number]  HPI: Tyrone Little is a 63 y.o. male  No chief complaint on file. Patient presents to clinic for 6 month follow-up appointment. Pt has hx of constipation, hyperlipidemia, schizoaffective disorder, seizures, and BPH. Pt is currently taking depakote, flomax, simvastatin, and risperidone and is tolerating meds well. Pt denies changes in BM or urination. Pt states that his mood has been stable. Pt reports having a seizure where he lost consciousness for a few moments several days ago. Pt is seen by neurology and psychiatry. According to neurology note, patient is to reach out to them with new seizure like activity.  Recommend patient follow up with neurology.        05/05/2023    2:36 PM 09/09/2022    2:34 PM 08/08/2022    1:22 PM  Depression screen PHQ 2/9  Decreased Interest 0 0 0  Down, Depressed, Hopeless 0 0 0  PHQ - 2 Score 0 0 0  Altered sleeping 0  0  Tired, decreased energy 0  0  Change in appetite 0  0  Feeling bad or failure about yourself  0  0  Trouble concentrating 0  0  Moving slowly or fidgety/restless 0  0  Suicidal thoughts 0  0  PHQ-9 Score 0  0  Difficult doing work/chores Not difficult at all  Not difficult at all    Relevant past medical, surgical, family and social history reviewed and updated as indicated. Interim medical history since our last visit reviewed. Allergies and medications reviewed and updated.  Constitutional: Negative for fever or weight change.  Respiratory: Negative for cough and shortness of breath.   Cardiovascular: Negative for chest pain or palpitations.  Gastrointestinal: Negative for abdominal pain, no bowel changes.  Musculoskeletal: Negative for gait problem or joint swelling.  Skin: Negative for rash.   Neurological: reports recent episode of seizures, lost consciousness momentarily, reports as his baseline .  No other specific complaints in a complete review of systems (except as listed in HPI above).       Objective:    BP 124/72   Pulse 91   Temp 97.8 F (36.6 C)   Resp 16   Ht 5' 11.25" (1.81 m)   Wt 221 lb (100.2 kg)   SpO2 97%   BMI 30.61 kg/m   BP Readings from Last 3 Encounters:  05/05/23 124/72  09/09/22 130/76  08/08/22 130/80     Wt Readings from Last 3 Encounters:  05/05/23 221 lb (100.2 kg)  11/07/22 228 lb (103.4 kg)  09/09/22 228 lb 3.2 oz (103.5 kg)    Physical Exam Vitals reviewed.  Constitutional:      Appearance: Normal appearance.  HENT:     Head: Normocephalic.  Cardiovascular:     Rate and Rhythm: Normal rate and regular rhythm.  Pulmonary:     Effort: Pulmonary effort is normal.     Breath sounds: Normal breath sounds.  Musculoskeletal:        General: Normal range of motion.  Skin:    General: Skin is warm and dry.  Neurological:     General: No focal deficit present.  Mental Status: He is alert and oriented to person, place, and time. Mental status is at baseline.  Psychiatric:        Mood and Affect: Mood normal.        Behavior: Behavior normal.        Thought Content: Thought content normal.        Judgment: Judgment normal.     Results for orders placed or performed in visit on 08/08/22  CBC with Differential/Platelet   Collection Time: 08/08/22  2:03 PM  Result Value Ref Range   WBC 4.5 3.8 - 10.8 Thousand/uL   RBC 4.81 4.20 - 5.80 Million/uL   Hemoglobin 14.0 13.2 - 17.1 g/dL   HCT 16.1 09.6 - 04.5 %   MCV 86.5 80.0 - 100.0 fL   MCH 29.1 27.0 - 33.0 pg   MCHC 33.7 32.0 - 36.0 g/dL   RDW 40.9 81.1 - 91.4 %   Platelets 210 140 - 400 Thousand/uL   MPV 10.4 7.5 - 12.5 fL   Neutro Abs 2,624 1,500 - 7,800 cells/uL   Lymphs Abs 1,485 850 - 3,900 cells/uL   Absolute Monocytes 320 200 - 950 cells/uL   Eosinophils  Absolute 41 15 - 500 cells/uL   Basophils Absolute 32 0 - 200 cells/uL   Neutrophils Relative % 58.3 %   Total Lymphocyte 33.0 %   Monocytes Relative 7.1 %   Eosinophils Relative 0.9 %   Basophils Relative 0.7 %  COMPLETE METABOLIC PANEL WITH GFR   Collection Time: 08/08/22  2:03 PM  Result Value Ref Range   Glucose, Bld 83 65 - 99 mg/dL   BUN 21 7 - 25 mg/dL   Creat 7.82 9.56 - 2.13 mg/dL   eGFR 85 > OR = 60 YQ/MVH/8.46N6   BUN/Creatinine Ratio SEE NOTE: 6 - 22 (calc)   Sodium 143 135 - 146 mmol/L   Potassium 4.4 3.5 - 5.3 mmol/L   Chloride 105 98 - 110 mmol/L   CO2 31 20 - 32 mmol/L   Calcium 8.8 8.6 - 10.3 mg/dL   Total Protein 6.1 6.1 - 8.1 g/dL   Albumin 3.8 3.6 - 5.1 g/dL   Globulin 2.3 1.9 - 3.7 g/dL (calc)   AG Ratio 1.7 1.0 - 2.5 (calc)   Total Bilirubin 0.4 0.2 - 1.2 mg/dL   Alkaline phosphatase (APISO) 45 35 - 144 U/L   AST 12 10 - 35 U/L   ALT 10 9 - 46 U/L  Hemoglobin A1c   Collection Time: 08/08/22  2:03 PM  Result Value Ref Range   Hgb A1c MFr Bld 5.4 <5.7 % of total Hgb   Mean Plasma Glucose 108 mg/dL   eAG (mmol/L) 6.0 mmol/L  Hepatitis C antibody   Collection Time: 08/08/22  2:03 PM  Result Value Ref Range   Hepatitis C Ab NON-REACTIVE NON-REACTIVE  Lipid panel   Collection Time: 08/08/22  2:03 PM  Result Value Ref Range   Cholesterol 134 <200 mg/dL   HDL 41 > OR = 40 mg/dL   Triglycerides 96 <295 mg/dL   LDL Cholesterol (Calc) 75 mg/dL (calc)   Total CHOL/HDL Ratio 3.3 <5.0 (calc)   Non-HDL Cholesterol (Calc) 93 <284 mg/dL (calc)   Last CBC Lab Results  Component Value Date   WBC 4.5 08/08/2022   HGB 14.0 08/08/2022   HCT 41.6 08/08/2022   MCV 86.5 08/08/2022   MCH 29.1 08/08/2022   RDW 14.1 08/08/2022   PLT 210 08/08/2022   Last metabolic  panel Lab Results  Component Value Date   GLUCOSE 83 08/08/2022   NA 143 08/08/2022   K 4.4 08/08/2022   CL 105 08/08/2022   CO2 31 08/08/2022   BUN 21 08/08/2022   CREATININE 1.01 08/08/2022    EGFR 85 08/08/2022   CALCIUM 8.8 08/08/2022   PROT 6.1 08/08/2022   ALBUMIN 3.8 08/25/2021   BILITOT 0.4 08/08/2022   ALKPHOS 66 08/25/2021   AST 12 08/08/2022   ALT 10 08/08/2022   ANIONGAP 8 08/25/2021   Last lipids Lab Results  Component Value Date   CHOL 134 08/08/2022   HDL 41 08/08/2022   LDLCALC 75 08/08/2022   TRIG 96 08/08/2022   CHOLHDL 3.3 08/08/2022   Last hemoglobin A1c Lab Results  Component Value Date   HGBA1C 5.4 08/08/2022        Assessment & Plan:   Problem List Items Addressed This Visit       Digestive   Chronic constipation with overflow incontinence - Primary   Will continue current regimen (miralax daily) and follow up in 6 months. Patient tolerating well.  Uses adult briefs daily.       Irritable bowel syndrome with constipation   Will continue current regimen (miralax daily) and follow up in 6 months.  Patient is followed by GI.  No new symptoms.  Continue amitiza  24 mcg daily.         Genitourinary   Benign prostatic hyperplasia without lower urinary tract symptoms   will continue current treatment plan and follow-up in 6 months. Condition stable.currently taking flomax 0.4 mg daily.         Other   Hyperlipidemia   Labs ordered, test results pending, will review and follow-up. Currently taking simvastatin 40 mg daily      Relevant Orders   Lipid panel   Obesity   Comorbidities include BPH, IBS, HLD Continue working on lifestyle modification      Schizoaffective disorder (HCC)   Managed by psychiatry . Currently taking risperdal 2 mg BID, seroquel 200 mg at bedtime.       Hypokalemia   Labs ordered, test results pending, will review and follow-up      Seizure-like activity [facility])   Managed by neurology . Currently taking depakote 500 mg BID      Other Visit Diagnoses       Screening for diabetes mellitus       Relevant Orders   COMPLETE METABOLIC PANEL WITH GFR   Hemoglobin A1c     Screening for deficiency anemia        Relevant Orders   CBC with Differential/Platelet     Need for influenza vaccination       Relevant Orders   Flu vaccine trivalent PF, 6mos and older(Flulaval,Afluria,Fluarix,Fluzone) (Completed)         Follow up plan: Return in about 6 months (around 11/02/2023) for follow up.

## 2023-05-05 NOTE — Assessment & Plan Note (Addendum)
will continue current treatment plan and follow-up in 6 months. Condition stable.currently taking flomax 0.4 mg daily.

## 2023-05-05 NOTE — Assessment & Plan Note (Addendum)
Managed by neurology . Currently taking depakote 500 mg BID

## 2023-05-05 NOTE — Assessment & Plan Note (Addendum)
Will continue current regimen (miralax daily) and follow up in 6 months.  Patient is followed by GI.  No new symptoms.  Continue amitiza  24 mcg daily.

## 2023-05-06 LAB — COMPLETE METABOLIC PANEL WITHOUT GFR
AG Ratio: 1.4 (calc) (ref 1.0–2.5)
ALT: 17 U/L (ref 9–46)
AST: 23 U/L (ref 10–35)
Albumin: 3.8 g/dL (ref 3.6–5.1)
Alkaline phosphatase (APISO): 56 U/L (ref 35–144)
BUN/Creatinine Ratio: 22 (calc) (ref 6–22)
BUN: 26 mg/dL — ABNORMAL HIGH (ref 7–25)
CO2: 30 mmol/L (ref 20–32)
Calcium: 9.2 mg/dL (ref 8.6–10.3)
Chloride: 103 mmol/L (ref 98–110)
Creat: 1.2 mg/dL (ref 0.70–1.35)
Globulin: 2.7 g/dL (ref 1.9–3.7)
Glucose, Bld: 134 mg/dL — ABNORMAL HIGH (ref 65–99)
Potassium: 4.4 mmol/L (ref 3.5–5.3)
Sodium: 143 mmol/L (ref 135–146)
Total Bilirubin: 0.3 mg/dL (ref 0.2–1.2)
Total Protein: 6.5 g/dL (ref 6.1–8.1)
eGFR: 69 mL/min/1.73m2

## 2023-05-06 LAB — LIPID PANEL
Cholesterol: 128 mg/dL
HDL: 42 mg/dL
LDL Cholesterol (Calc): 64 mg/dL
Non-HDL Cholesterol (Calc): 86 mg/dL
Total CHOL/HDL Ratio: 3 (calc)
Triglycerides: 141 mg/dL

## 2023-05-06 LAB — CBC WITH DIFFERENTIAL/PLATELET
Absolute Lymphocytes: 1447 {cells}/uL (ref 850–3900)
Absolute Monocytes: 328 {cells}/uL (ref 200–950)
Basophils Absolute: 20 {cells}/uL (ref 0–200)
Basophils Relative: 0.5 %
Eosinophils Absolute: 12 {cells}/uL — ABNORMAL LOW (ref 15–500)
Eosinophils Relative: 0.3 %
HCT: 43.7 % (ref 38.5–50.0)
Hemoglobin: 14.8 g/dL (ref 13.2–17.1)
MCH: 28.8 pg (ref 27.0–33.0)
MCHC: 33.9 g/dL (ref 32.0–36.0)
MCV: 85.2 fL (ref 80.0–100.0)
MPV: 10.7 fL (ref 7.5–12.5)
Monocytes Relative: 8.4 %
Neutro Abs: 2094 {cells}/uL (ref 1500–7800)
Neutrophils Relative %: 53.7 %
Platelets: 180 10*3/uL (ref 140–400)
RBC: 5.13 10*6/uL (ref 4.20–5.80)
RDW: 13.1 % (ref 11.0–15.0)
Total Lymphocyte: 37.1 %
WBC: 3.9 10*3/uL (ref 3.8–10.8)

## 2023-05-06 LAB — HEMOGLOBIN A1C
Hgb A1c MFr Bld: 5.6 %{Hb}
Mean Plasma Glucose: 114 mg/dL
eAG (mmol/L): 6.3 mmol/L

## 2023-08-05 ENCOUNTER — Other Ambulatory Visit: Payer: Self-pay | Admitting: Nurse Practitioner

## 2023-08-07 NOTE — Telephone Encounter (Signed)
 Requested medication (s) are due for refill today: yes  Requested medication (s) are on the active medication list: yes  Last refill: 09/06/22  Future visit scheduled: yes  Notes to clinic:  Medication not assigned to a protocol, review manually.      Requested Prescriptions  Pending Prescriptions Disp Refills   omega-3 fish oil (MAXEPA) 1000 MG CAPS capsule [Pharmacy Med Name: Omega-3 Fish Oil 1000 MG Capsule] 28 capsule 10    Sig: @ TAKE 1 CAPSULE BY MOUTH ONCE DAILY *TAKE WITH FOOD*     Off-Protocol Failed - 08/07/2023  3:35 PM      Failed - Medication not assigned to a protocol, review manually.      Passed - Valid encounter within last 12 months    Recent Outpatient Visits           3 months ago Chronic constipation with overflow incontinence   [facility] Quinton Buckler, FNP       Future Appointments             In 3 months Abram Hoguet, Monalisa Angles, FNP [facility], [facility]           Endocrinology:  Nutritional Agents - omega-3 acid ethyl esters Failed - 08/07/2023  3:35 PM      Failed - Lipid Panel in normal range within the last 12 months    Cholesterol  Date Value Ref Range Status  05/05/2023 128 <200 mg/dL Final   LDL Cholesterol (Calc)  Date Value Ref Range Status  05/05/2023 64 mg/dL (calc) Final    Comment:    Reference range: <100 . Desirable range <100 mg/dL for primary prevention;   <70 mg/dL for patients with CHD or diabetic patients  with > or = 2 CHD risk factors. Aaron Aas LDL-C is now calculated using the Martin-Hopkins  calculation, which is a validated novel method providing  better accuracy than the Friedewald equation in the  estimation of LDL-C.  Melinda Sprawls et al. Erroll Heard. 1610;960(45): 2061-2068  (http://education.[employer].com/faq/FAQ164)    HDL  Date Value Ref Range Status  05/05/2023 42 > OR = 40 mg/dL Final   Triglycerides  Date Value Ref Range Status  05/05/2023 141 <150 mg/dL Final          Passed - Valid encounter within last 12 months    Recent Outpatient Visits           3 months ago Chronic constipation with overflow incontinence   [facility] Quinton Buckler, FNP       Future Appointments             In 3 months Abram Hoguet, Monalisa Angles, FNP [facility], [facility]           Over the Counter:  OTC Passed - 08/07/2023  3:35 PM      Passed - Valid encounter within last 12 months    Recent Outpatient Visits           3 months ago Chronic constipation with overflow incontinence   [facility] Quinton Buckler, FNP       Future Appointments             In 3 months Abram Hoguet, Monalisa Angles, FNP [facility], [facility]

## 2023-08-12 ENCOUNTER — Telehealth: Payer: Self-pay

## 2023-08-12 IMAGING — MR MR HEAD WO/W CM
14 of 16 series · 39 of 48 positions shown · IV contrast (gadavist)
Comparison: CT head from the same day.

CLINICAL DATA: Seizure, new-onset, no history of trauma

EXAM:
MRI HEAD WITHOUT AND WITH CONTRAST
TECHNIQUE: Multiplanar, multiecho pulse sequences of the brain and surrounding
structures were obtained without and with intravenous contrast.
CONTRAST:  10mL GADAVIST GADOBUTROL 1 MMOL/ML IV SOLN

[Series 5: ax dwi_tracew · axial · 3.0mm · 0.71mm/px · z∈[-58,+106]mm · 2 of 55 slices shown]
[im 1/55]
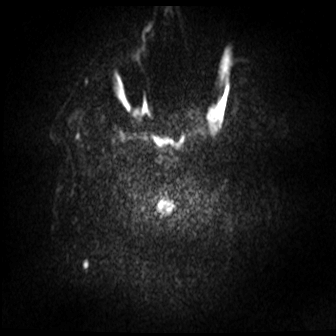
[im 55/55]
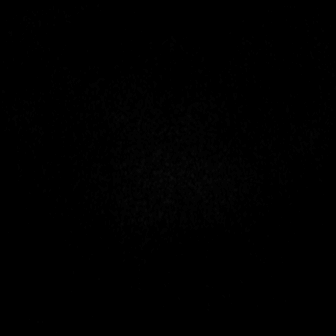

[Series 6: ax dwi_adc · axial · 3.0mm · 0.71mm/px · z∈[-58,+103]mm · 3 of 55 slices shown]
[im 1/55]
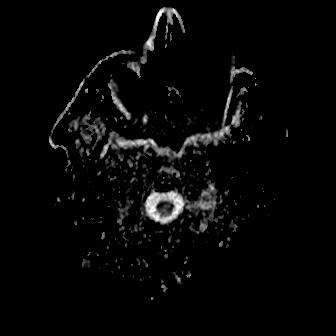
[im 28/55]
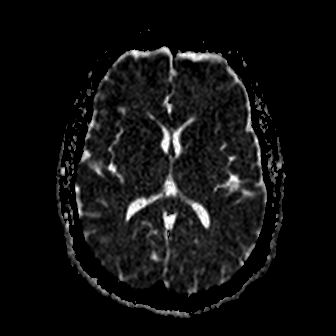
[im 55/55]
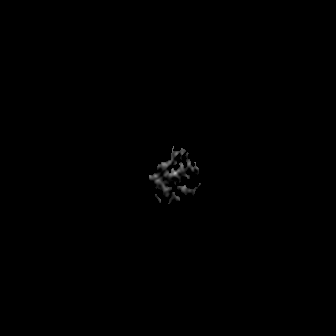

[Series 7: cor dwi_tracew · coronal · 5.0mm · 0.68mm/px · 2 of 40 slices shown]
[im 1/40]
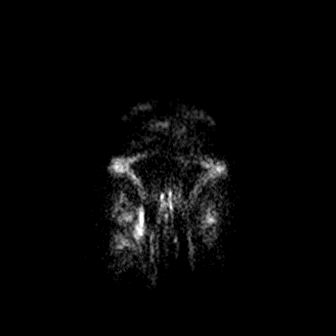
[im 40/40]
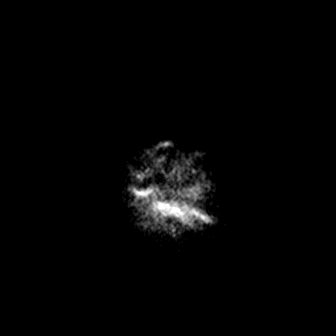

[Series 8: cor dwi_adc · coronal · 5.0mm · 0.68mm/px · 2 of 40 slices shown]
[im 1/40]
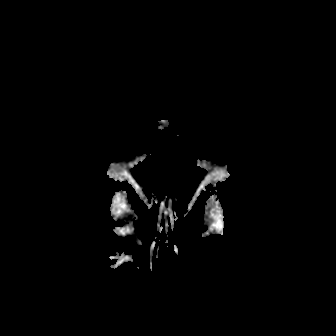
[im 40/40]
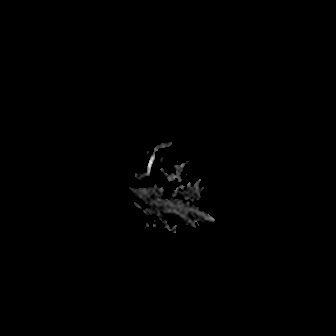

[Series 9: T1 · sagittal · 5.0mm · 0.47mm/px · 1 of 24 slices shown (1 of 2)]
[im 1/24]
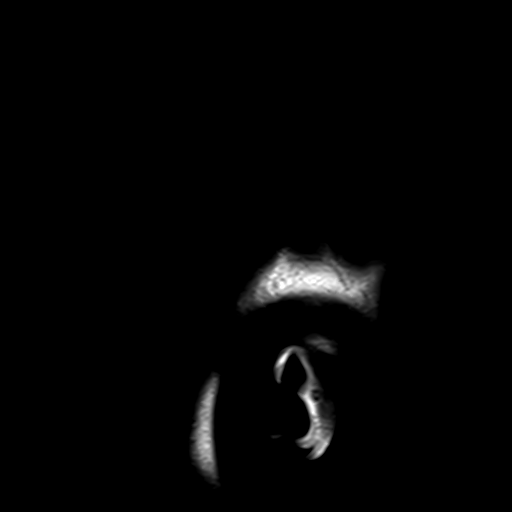

[Series 10: T2 · axial · 5.0mm · 0.86mm/px · 1 of 27 slices shown (1 of 2)]
[im 1/27]
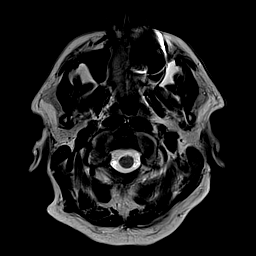

[Series 12: ax swi_pha · axial · 3.0mm · 0.90mm/px · z∈[-59,+105]mm · 3 of 56 slices shown]
[im 1/56]
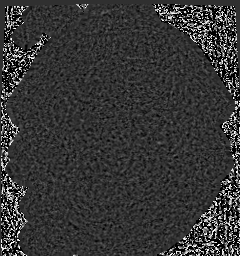
[im 28/56]
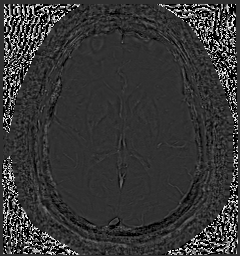
[im 56/56]
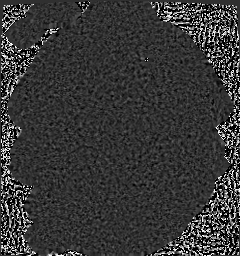

[Series 16: T1 · axial · 1.0mm · 0.98mm/px · z∈[-55,+103]mm · 8 of 160 slices shown (2 of 2)]
[im 1/160]
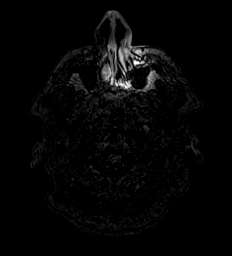
[im 23/160]
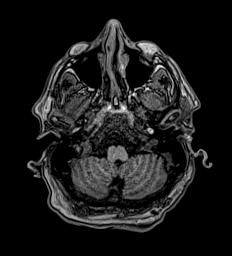
[im 46/160]
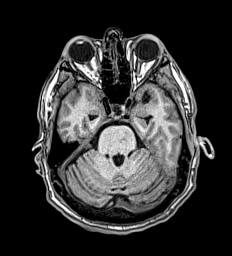
[im 69/160]
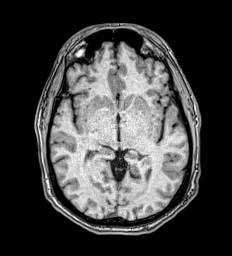
[im 91/160]
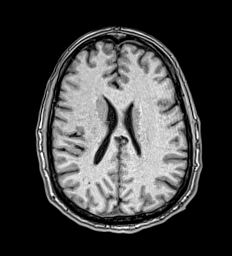
[im 114/160]
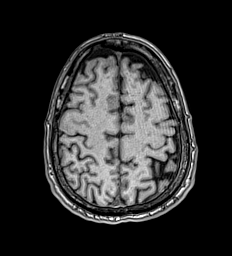
[im 137/160]
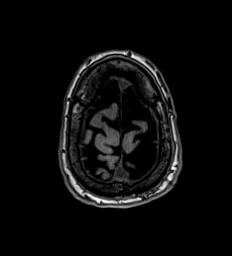
[im 160/160]
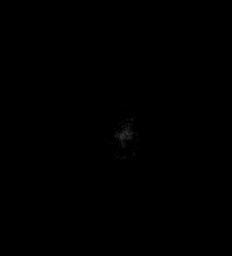

[Series 18: FLAIR · axial · 3.0mm · 0.69mm/px · z∈[-58,+103]mm · 3 of 55 slices shown (1 of 2)]
[im 1/55]
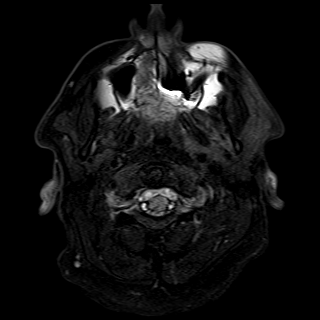
[im 28/55]
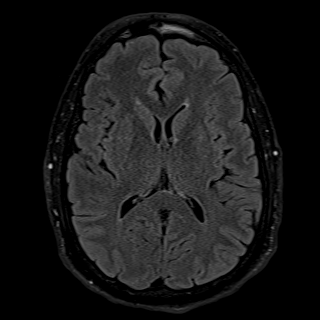
[im 55/55]
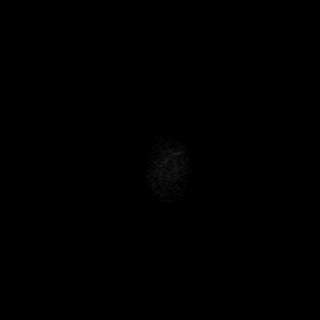

[Series 24: T2 · coronal · 3.0mm · 0.23mm/px · 2 of 35 slices shown (2 of 2)]
[im 1/35]
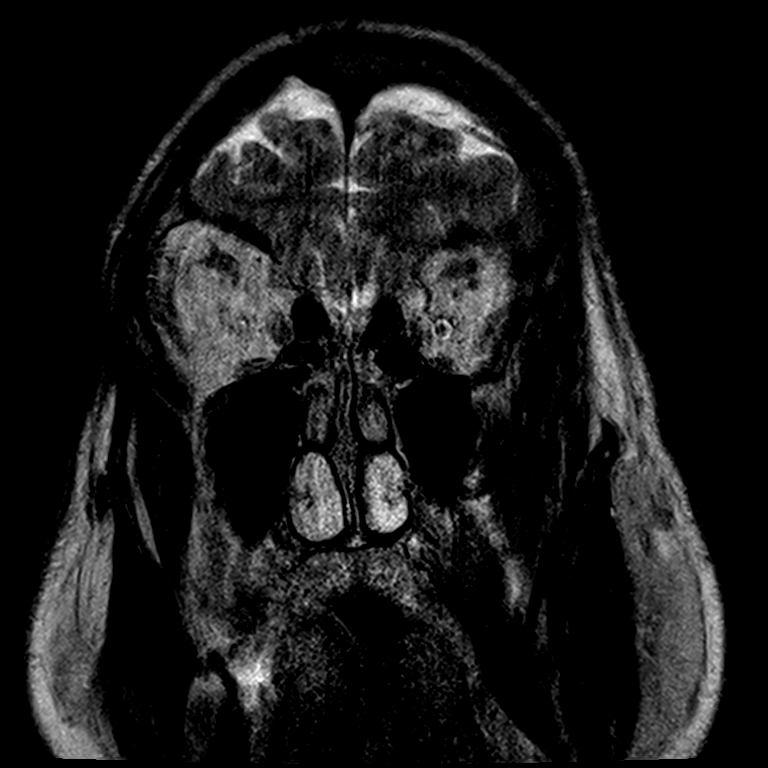
[im 35/35]
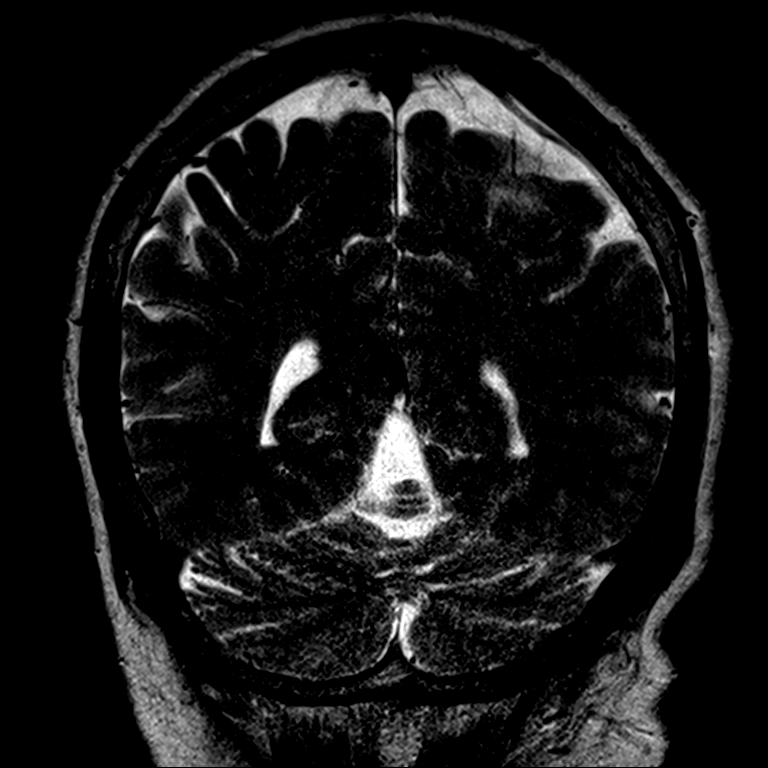

[Series 25: FLAIR · coronal · 3.0mm · 0.35mm/px · 2 of 35 slices shown (2 of 2)]
[im 1/35]
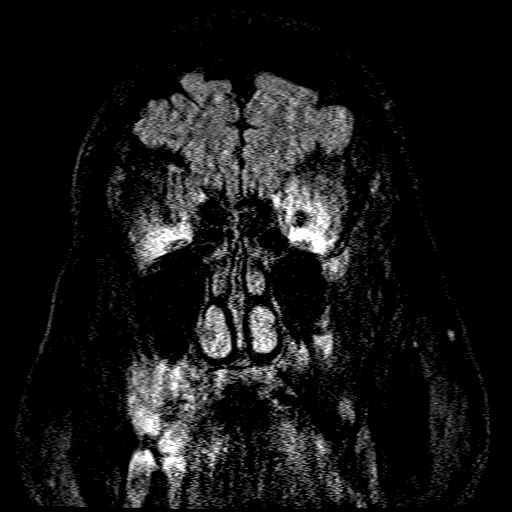
[im 35/35]
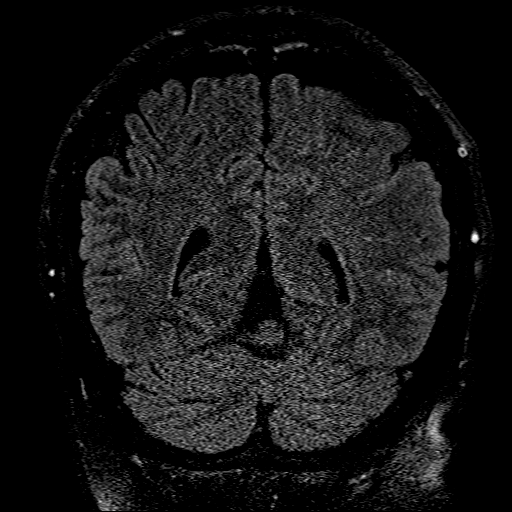

[Series 26: T1 post-contrast · axial · 1.0mm · 0.98mm/px · z∈[-72,+85]mm · 8 of 160 slices shown (1 of 3)]
[im 1/160]
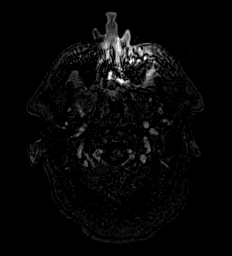
[im 23/160]
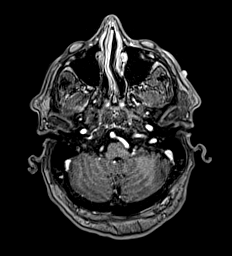
[im 46/160]
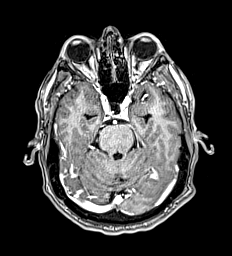
[im 69/160]
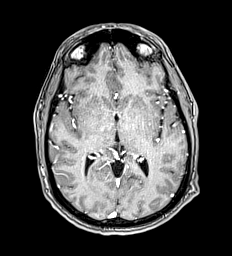
[im 91/160]
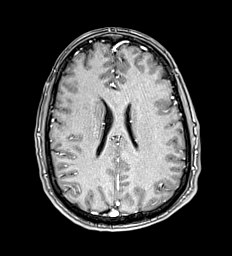
[im 114/160]
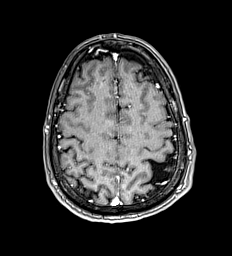
[im 137/160]
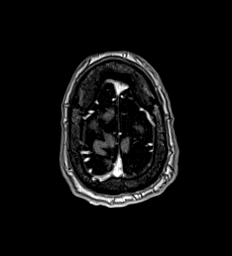
[im 160/160]
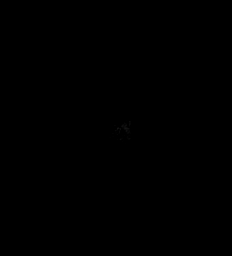

[Series 27: T1 post-contrast · coronal · 5.0mm · 0.43mm/px · 1 of 30 slices shown (2 of 3)]
[im 1/30]
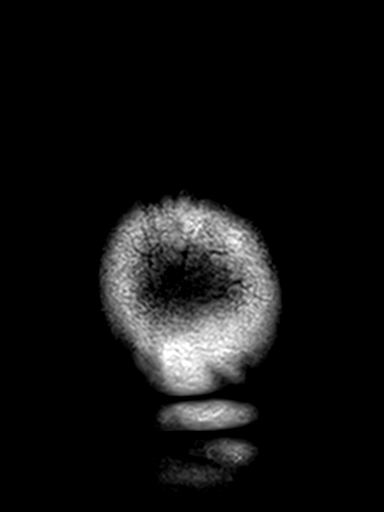

[Series 28: T1 post-contrast · coronal · 5.0mm · 0.43mm/px · 1 of 30 slices shown (3 of 3)]
[im 1/30]
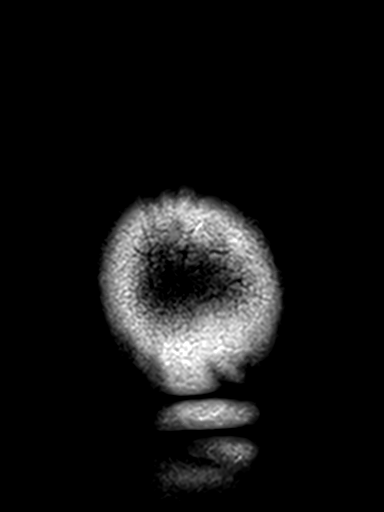

[39 of 48 positions shown; findings below may reference images not displayed]

FINDINGS: Brain: No acute infarction, hemorrhage, hydrocephalus, extra-axial
collection or mass lesion. Question mildly asymmetrically small left
hippocampus, which could represent mesial temporal sclerosis in the
correct clinical setting. No pathologic enhancement.

Vascular: Major arterial flow voids are maintained at the skull
base.

Skull and upper cervical spine: Normal marrow signal.

Sinuses/Orbits: No mastoid

Other: No mastoid effusions.
IMPRESSION: 1. Question mildly asymmetrically small left hippocampus, which
could represent mesial temporal sclerosis in the correct clinical
setting. Consider correlation with EEG findings.
2. Otherwise, no evidence of acute intracranial abnormality.

## 2023-08-12 MED ORDER — OMEGA-3 FISH OIL 1000 MG PO CAPS: 1.0000 | ORAL_CAPSULE | Freq: Every day | ORAL | 10 refills | Status: AC

## 2023-08-12 MED ORDER — THEREMS PO TABS: 1.0000 | ORAL_TABLET | Freq: Every day | ORAL | 10 refills | Status: AC

## 2023-08-12 NOTE — Telephone Encounter (Signed)
 Refills

## 2023-09-01 ENCOUNTER — Telehealth: Payer: Self-pay | Admitting: Nurse Practitioner

## 2023-09-01 ENCOUNTER — Other Ambulatory Visit: Payer: Self-pay | Admitting: Nurse Practitioner

## 2023-09-01 DIAGNOSIS — J301 Allergic rhinitis due to pollen: Secondary | ICD-10-CM

## 2023-09-01 DIAGNOSIS — E785 Hyperlipidemia, unspecified: Secondary | ICD-10-CM

## 2023-09-01 DIAGNOSIS — N400 Enlarged prostate without lower urinary tract symptoms: Secondary | ICD-10-CM

## 2023-09-01 MED ORDER — SIMVASTATIN 40 MG PO TABS: 40.0000 mg | ORAL_TABLET | Freq: Every day | ORAL | 0 refills | Status: DC

## 2023-09-01 NOTE — Telephone Encounter (Signed)
simvastatin (ZOCOR) 40 MG tablet °

## 2023-09-02 NOTE — Telephone Encounter (Signed)
 Requested Prescriptions  Pending Prescriptions Disp Refills   pioglitazone  (ACTOS ) 15 MG tablet [Pharmacy Med Name: Pioglitazone  HCl 15 MG Tablet] 28 tablet 10    Sig: @ TAKE 1 TABLET BY MOUTH ONCE DAILY     Endocrinology:  Diabetes - Glitazones - pioglitazone  Passed - 09/02/2023  1:48 PM      Passed - HBA1C is between 0 and 7.9 and within 180 days    Hgb A1c MFr Bld  Date Value Ref Range Status  05/05/2023 5.6 <5.7 % of total Hgb Final    Comment:    For the purpose of screening for the presence of diabetes: . <5.7%       Consistent with the absence of diabetes 5.7-6.4%    Consistent with increased risk for diabetes             (prediabetes) > or =6.5%  Consistent with diabetes . This assay result is consistent with a decreased risk of diabetes. . Currently, no consensus exists regarding use of hemoglobin A1c for diagnosis of diabetes in children. . According to American Diabetes Association (ADA) guidelines, hemoglobin A1c <7.0% represents optimal control in non-pregnant diabetic patients. Different metrics may apply to specific patient populations.  Standards of Medical Care in Diabetes(ADA). Temple Feeler - Valid encounter within last 6 months    Recent Outpatient Visits           4 months ago Chronic constipation with overflow incontinence   [facility] Quinton Buckler, FNP       Future Appointments             In 2 months Abram Hoguet, Monalisa Angles, FNP [facility]             tamsulosin  (FLOMAX ) 0.4 MG CAPS capsule [Pharmacy Med Name: Tamsulosin  HCl 0.4 MG Capsule] 28 capsule 10    Sig: @ TAKE 1 CAPSULE BY MOUTH ONCE DAILY  *DO NOT CRUSH OR CHEW* **DO NOT OPEN CAPSULE**     Urology: Alpha-Adrenergic Blocker Failed - 09/02/2023  1:48 PM      Failed - PSA in normal range and within 360 days    No results found for: "LABPSA", "PSA", "PSA1", "ULTRAPSA"       Passed - Last BP in normal range    BP  Readings from Last 1 Encounters:  05/05/23 124/72         Passed - Valid encounter within last 12 months    Recent Outpatient Visits           4 months ago Chronic constipation with overflow incontinence   [facility] Quinton Buckler, FNP       Future Appointments             In 2 months Abram Hoguet, Monalisa Angles, FNP [facility], [facility]             cetirizine  (ZYRTEC ) 10 MG tablet [Pharmacy Med Name: Cetirizine  HCl 10 MG Tablet] 28 tablet 10    Sig: @ TAKE 1 TABLET BY MOUTH ONCE DAILY     Ear, Nose, and Throat:  Antihistamines 2 Passed - 09/02/2023  1:48 PM      Passed - Cr in normal range and within 360 days    Creat  Date Value Ref Range Status  05/05/2023 1.20 0.70 - 1.35 mg/dL Final         Passed -  Valid encounter within last 12 months    Recent Outpatient Visits           4 months ago Chronic constipation with overflow incontinence   [facility] Donny Gall F, FNP       Future Appointments             In 2 months Abram Hoguet, Monalisa Angles, FNP [facility]            Refused Prescriptions Disp Refills   simvastatin  (ZOCOR ) 40 MG tablet [Pharmacy Med Name: Simvastatin  40 MG Tablet] 28 tablet 10    Sig: TAKE 1 TABLET BY MOUTH EVERY NIGHT AT BEDTIME     Cardiovascular:  Antilipid - Statins Failed - 09/02/2023  1:48 PM      Failed - Lipid Panel in normal range within the last 12 months    Cholesterol  Date Value Ref Range Status  05/05/2023 128 <200 mg/dL Final   LDL Cholesterol (Calc)  Date Value Ref Range Status  05/05/2023 64 mg/dL (calc) Final    Comment:    Reference range: <100 . Desirable range <100 mg/dL for primary prevention;   <70 mg/dL for patients with CHD or diabetic patients  with > or = 2 CHD risk factors. Aaron Aas LDL-C is now calculated using the Martin-Hopkins  calculation, which is a validated novel method providing  better accuracy than  the Friedewald equation in the  estimation of LDL-C.  Melinda Sprawls et al. Erroll Heard. 1610;960(45): 2061-2068  (http://education.[facility].com/faq/FAQ164)    HDL  Date Value Ref Range Status  05/05/2023 42 > OR = 40 mg/dL Final   Triglycerides  Date Value Ref Range Status  05/05/2023 141 <150 mg/dL Final         Passed - Patient is not pregnant      Passed - Valid encounter within last 12 months    Recent Outpatient Visits           4 months ago Chronic constipation with overflow incontinence   [facility] Quinton Buckler, FNP       Future Appointments             In 2 months Abram Hoguet, Monalisa Angles, FNP [facility], [facility]

## 2023-09-02 NOTE — Telephone Encounter (Signed)
 Requested medications are due for refill today.  yes  Requested medications are on the active medications list.  yes  Last refill. 02/18/2023 #30 6 rf  Future visit scheduled.   yes  Notes to clinic.  Missing labs    Requested Prescriptions  Pending Prescriptions Disp Refills   tamsulosin  (FLOMAX ) 0.4 MG CAPS capsule [Pharmacy Med Name: Tamsulosin  HCl 0.4 MG Capsule] 28 capsule 10    Sig: @ TAKE 1 CAPSULE BY MOUTH ONCE DAILY  *DO NOT CRUSH OR CHEW* **DO NOT OPEN CAPSULE**     Urology: Alpha-Adrenergic Blocker Failed - 09/02/2023  1:49 PM      Failed - PSA in normal range and within 360 days    No results found for: "LABPSA", "PSA", "PSA1", "ULTRAPSA"       Passed - Last BP in normal range    BP Readings from Last 1 Encounters:  05/05/23 124/72         Passed - Valid encounter within last 12 months    Recent Outpatient Visits           4 months ago Chronic constipation with overflow incontinence   [facility] Quinton Buckler, FNP       Future Appointments             In 2 months Abram Hoguet, Monalisa Angles, FNP [facility], [facility]            Signed Prescriptions Disp Refills   pioglitazone  (ACTOS ) 15 MG tablet 90 tablet 0    Sig: @ TAKE 1 TABLET BY MOUTH ONCE DAILY     Endocrinology:  Diabetes - Glitazones - pioglitazone  Passed - 09/02/2023  1:49 PM      Passed - HBA1C is between 0 and 7.9 and within 180 days    Hgb A1c MFr Bld  Date Value Ref Range Status  05/05/2023 5.6 <5.7 % of total Hgb Final    Comment:    For the purpose of screening for the presence of diabetes: . <5.7%       Consistent with the absence of diabetes 5.7-6.4%    Consistent with increased risk for diabetes             (prediabetes) > or =6.5%  Consistent with diabetes . This assay result is consistent with a decreased risk of diabetes. . Currently, no consensus exists regarding use of hemoglobin A1c for diagnosis of diabetes in  children. . According to American Diabetes Association (ADA) guidelines, hemoglobin A1c <7.0% represents optimal control in non-pregnant diabetic patients. Different metrics may apply to specific patient populations.  Standards of Medical Care in Diabetes(ADA). Temple Feeler - Valid encounter within last 6 months    Recent Outpatient Visits           4 months ago Chronic constipation with overflow incontinence   [facility] Quinton Buckler, FNP       Future Appointments             In 2 months Abram Hoguet, Monalisa Angles, FNP [facility]             cetirizine  (ZYRTEC ) 10 MG tablet 90 tablet 1    Sig: @ TAKE 1 TABLET BY MOUTH ONCE DAILY     Ear, Nose, and Throat:  Antihistamines 2 Passed - 09/02/2023  1:49 PM      Passed - Cr in  normal range and within 360 days    Creat  Date Value Ref Range Status  05/05/2023 1.20 0.70 - 1.35 mg/dL Final         Passed - Valid encounter within last 12 months    Recent Outpatient Visits           4 months ago Chronic constipation with overflow incontinence   [facility] Donny Gall F, FNP       Future Appointments             In 2 months Abram Hoguet, Monalisa Angles, FNP [facility], [facility]            Refused Prescriptions Disp Refills   simvastatin  (ZOCOR ) 40 MG tablet [Pharmacy Med Name: Simvastatin  40 MG Tablet] 28 tablet 10    Sig: TAKE 1 TABLET BY MOUTH EVERY NIGHT AT BEDTIME     Cardiovascular:  Antilipid - Statins Failed - 09/02/2023  1:49 PM      Failed - Lipid Panel in normal range within the last 12 months    Cholesterol  Date Value Ref Range Status  05/05/2023 128 <200 mg/dL Final   LDL Cholesterol (Calc)  Date Value Ref Range Status  05/05/2023 64 mg/dL (calc) Final    Comment:    Reference range: <100 . Desirable range <100 mg/dL for primary prevention;   <70 mg/dL for patients with CHD or diabetic patients   with > or = 2 CHD risk factors. Aaron Aas LDL-C is now calculated using the Martin-Hopkins  calculation, which is a validated novel method providing  better accuracy than the Friedewald equation in the  estimation of LDL-C.  Melinda Sprawls et al. Erroll Heard. 1610;960(45): 2061-2068  (http://education.[facility].com/faq/FAQ164)    HDL  Date Value Ref Range Status  05/05/2023 42 > OR = 40 mg/dL Final   Triglycerides  Date Value Ref Range Status  05/05/2023 141 <150 mg/dL Final         Passed - Patient is not pregnant      Passed - Valid encounter within last 12 months    Recent Outpatient Visits           4 months ago Chronic constipation with overflow incontinence   [facility] Quinton Buckler, FNP       Future Appointments             In 2 months Abram Hoguet, Monalisa Angles, FNP [facility], [facility]
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
## 2023-09-16 DIAGNOSIS — E119 Type 2 diabetes mellitus without complications: Secondary | ICD-10-CM | POA: Diagnosis not present

## 2023-09-16 DIAGNOSIS — H2513 Age-related nuclear cataract, bilateral: Secondary | ICD-10-CM | POA: Diagnosis not present

## 2023-09-16 DIAGNOSIS — H16223 Keratoconjunctivitis sicca, not specified as Sjogren's, bilateral: Secondary | ICD-10-CM | POA: Diagnosis not present

## 2023-09-24 ENCOUNTER — Encounter: Payer: Self-pay | Admitting: Nurse Practitioner

## 2023-09-24 ENCOUNTER — Ambulatory Visit (INDEPENDENT_AMBULATORY_CARE_PROVIDER_SITE_OTHER): Admitting: Nurse Practitioner

## 2023-09-24 VITALS — BP 116/72 | HR 73 | Temp 97.5°F | Resp 18 | Ht 71.25 in | Wt 230.4 lb

## 2023-09-24 DIAGNOSIS — Z0001 Encounter for general adult medical examination with abnormal findings: Secondary | ICD-10-CM | POA: Diagnosis not present

## 2023-09-24 DIAGNOSIS — Z0000 Encounter for general adult medical examination without abnormal findings: Secondary | ICD-10-CM

## 2023-09-24 DIAGNOSIS — R21 Rash and other nonspecific skin eruption: Secondary | ICD-10-CM

## 2023-09-24 DIAGNOSIS — Z1211 Encounter for screening for malignant neoplasm of colon: Secondary | ICD-10-CM

## 2023-09-24 MED ORDER — TRIAMCINOLONE ACETONIDE 0.1 % EX OINT: 1.0000 | TOPICAL_OINTMENT | Freq: Two times a day (BID) | CUTANEOUS | 6 refills | Status: AC

## 2023-09-24 NOTE — Progress Notes (Signed)
 Subjective:   Tyrone Little is a 63 y.o. male who presents for Medicare Annual/Subsequent preventive examination, physical and acute visit.  Visit Complete: In person  Patient Medicare AWV questionnaire was completed by the patient on 09/24/2023; I have confirmed that all information answered by patient is correct and no changes since this date.    RASH: pruritic rash noted on left anitcupital area of left arm.  Patient reports it has been there two days.  He says it itches.  Denies any new soaps, detergents or lotions.  Will treat with kenalog cream.   Diet:  well balanced diet Exercise: walks daily Sleeping: 8 hours nightly Dental exam: needs to schedule Eye exam:  a few days ago, [facility]      Objective:    Today's Vitals   09/24/23 1040 09/24/23 1041  BP: 116/72   Pulse: 73   Resp: 18   Temp: (!) 97.5 F (36.4 C)   SpO2: 97%   Weight: 230 lb 6.4 oz (104.5 kg)   Height: 5' 11.25 (1.81 m)   PainSc:  2    Body mass index is 31.91 kg/m.     11/07/2022   11:50 AM 09/18/2021    6:34 PM 08/25/2021    2:16 PM 09/22/2020   12:10 PM  Advanced Directives  Does Patient Have a Medical Advance Directive? Yes No No No  Type of Advance Directive Healthcare Power of Attorney       Current Medications (verified) Outpatient Encounter Medications as of 09/24/2023  Medication Sig   acetaminophen  (TYLENOL ) 500 MG tablet Take 2 tablets (1,000 mg total) by mouth every 8 (eight) hours as needed for fever (pain).   cetirizine  (ZYRTEC ) 10 MG tablet @ TAKE 1 TABLET BY MOUTH ONCE DAILY   Cholecalciferol (VITAMIN D3) 125 MCG (5000 UT) CAPS TAKE 1 CAPSULE BY MOUTH ONCE DAILY *TAKE WITH FOOD*   divalproex  (DEPAKOTE  SPRINKLE) 125 MG capsule Take 4 capsules (500 mg total) by mouth every 12 (twelve) hours.   fluPHENAZine  (PROLIXIN ) 5 MG tablet Take 5 mg by mouth at bedtime.   ibuprofen  (ADVIL ) 600 MG tablet Take 1 tablet (600 mg total) by mouth every 6 (six) hours as needed (pain).    Incontinence Supply Disposable (COMFORT SHIELD ADULT DIAPERS) MISC 60 each by Does not apply route in the morning and at bedtime.   lubiprostone  (AMITIZA ) 24 MCG capsule TAKE 1 CAPSULE BY MOUTH TWICE DAILY WITH MEALS   Multiple Vitamin (THEREMS) TABS Take 1 tablet by mouth daily.   omega-3 fish oil  (MAXEPA) 1000 MG CAPS capsule Take 1 capsule (1,000 mg total) by mouth daily.   OVER THE COUNTER MEDICATION Take 1 Scoop by mouth daily. Best Fiber Powder   pioglitazone  (ACTOS ) 15 MG tablet @ TAKE 1 TABLET BY MOUTH ONCE DAILY   QUEtiapine  (SEROQUEL ) 200 MG tablet Take 200 mg by mouth at bedtime.   risperiDONE  (RISPERDAL ) 2 MG tablet Take 2 mg by mouth 2 (two) times daily.   simvastatin  (ZOCOR ) 40 MG tablet Take 1 tablet (40 mg total) by mouth at bedtime.   tamsulosin  (FLOMAX ) 0.4 MG CAPS capsule @ TAKE 1 CAPSULE BY MOUTH ONCE DAILY  *DO NOT CRUSH OR CHEW* **DO NOT OPEN CAPSULE**   triamcinolone ointment (KENALOG) 0.1 % Apply 1 Application topically 2 (two) times daily. To affected areas   No facility-administered encounter medications on file as of 09/24/2023.    Allergies (verified) Patient has no known allergies.   History: Past Medical History:  Diagnosis  Date   Schizotaxia    History reviewed. No pertinent surgical history. History reviewed. No pertinent family history. Social History   Socioeconomic History   Marital status: Single    Spouse name: Not on file   Number of children: Not on file   Years of education: Not on file   Highest education level: Not on file  Occupational History   Not on file  Tobacco Use   Smoking status: Former    Current packs/day: 0.00    Types: Cigarettes    Quit date: 2007    Years since quitting: 18.4   Smokeless tobacco: Never  Vaping Use   Vaping status: Never Used  Substance and Sexual Activity   Alcohol use: Never   Drug use: Never   Sexual activity: Never  Other Topics Concern   Not on file  Social History Narrative   [facility] [address] [place], [place]  [phone]   Social Drivers of Health   Financial Resource Strain: Low Risk  (09/24/2023)   Overall Financial Resource Strain (CARDIA)    Difficulty of Paying Living Expenses: Not hard at all  Food Insecurity: No Food Insecurity (09/24/2023)   Hunger Vital Sign    Worried About Running Out of Food in the Last Year: Never true    Ran Out of Food in the Last Year: Never true  Transportation Needs: No Transportation Needs (09/24/2023)   PRAPARE - Administrator, Civil Service (Medical): No    Lack of Transportation (Non-Medical): No  Physical Activity: Inactive (09/24/2023)   Exercise Vital Sign    Days of Exercise per Week: 0 days    Minutes of Exercise per Session: 0 min  Stress: No Stress Concern Present (09/24/2023)   [employer] of Occupational Health - Occupational Stress Questionnaire    Feeling of Stress: Not at all  Social Connections: Unknown (09/24/2023)   Social Connection and Isolation Panel    Frequency of Communication with Friends and Family: Patient unable to answer    Frequency of Social Gatherings with Friends and Family: Never    Attends Religious Services: Never    Database administrator or Organizations: No    Attends Engineer, structural: Never    Marital Status: Divorced    Tobacco Counseling Counseling given: Not Answered   Clinical Intake:  Pre-visit preparation completed: Yes  Pain : 0-10 Pain Score: 2  Pain Type: Acute pain Pain Location: Hip Pain Orientation: Left, Right Pain Descriptors / Indicators: Aching Pain Frequency: Occasional      BMI - recorded: 31.91 Nutritional Status: BMI > 30  Obese  How often do you need to have someone help you when you read instructions, pamphlets, or other written materials from your doctor or pharmacy?: 2 - Rarely  Interpreter Needed?: No      Activities of Daily Living    09/24/2023   10:42 AM 11/07/2022   11:50 AM  In your present  state of health, do you have any difficulty performing the following activities:  Hearing? 0 0  Vision? 1 0  Difficulty concentrating or making decisions? 1 1  Walking or climbing stairs? 0 0  Dressing or bathing? 1 0  Doing errands, shopping? 1 1  Preparing Food and eating ? Y N  Using the Toilet? N N  In the past six months, have you accidently leaked urine? N N  Do you have problems with loss of bowel control? N Y  Managing  your Medications? Y Y  Managing your Finances? Y Y  Housekeeping or managing your Housekeeping? CINDERELLA CINDERELLA    Patient Care Team: Gareth Mliss FALCON, FNP as PCP - General (Nurse Practitioner)  Indicate any recent Medical Services you may have received from other than [facility] providers in the past year (date may be approximate).     Assessment:   This is a routine wellness examination for [place].  Constitutional: Patient appears well-developed and well-nourished. No distress.  HENT: Head: Normocephalic and atraumatic. Ears: B TMs ok, no erythema or effusion; Nose: Nose normal. Mouth/Throat: Oropharynx is clear and moist. No oropharyngeal exudate.  Eyes: Conjunctivae and EOM are normal. Pupils are equal, round, and reactive to light. No scleral icterus.  Neck: Normal range of motion. Neck supple. No JVD present. No thyromegaly present.  Cardiovascular: Normal rate, regular rhythm and normal heart sounds.  No murmur heard. No BLE edema. Pulmonary/Chest: Effort normal and breath sounds normal. No respiratory distress. Abdominal: Soft. Bowel sounds are normal, no distension. There is no tenderness. no masses MALE GENITALIA: Normal descended testes bilaterally, no masses palpated, no hernias, no lesions, no discharge RECTAL: Prostate normal size and consistency, no rectal masses or hemorrhoids Musculoskeletal: Normal range of motion, no joint effusions. No gross deformities Neurological: he is alert and oriented to person, place, and time. No cranial nerve deficit. Coordination,  balance, strength, speech and gait are normal.  Skin: Skin is warm and dry.  rash noted on left arm. No erythema.  Psychiatric: Patient has a normal mood and affect. behavior is normal. Judgment and thought content normal.   Hearing/Vision screen Hearing Screening   500Hz  1000Hz  2000Hz  4000Hz   Right ear Pass Fail Pass Pass  Left ear Pass Pass Pass Pass     Goals Addressed   None    Depression Screen    09/24/2023   10:46 AM 09/24/2023   10:45 AM 05/05/2023    2:36 PM 09/09/2022    2:34 PM 08/08/2022    1:22 PM 03/20/2022    1:27 PM 09/19/2021    1:05 PM  PHQ 2/9 Scores  PHQ - 2 Score 1 1 0 0 0 0 0  PHQ- 9 Score 2  0  0      Fall Risk    09/24/2023   10:45 AM 05/05/2023    1:33 PM 11/07/2022   11:47 AM 09/09/2022    2:33 PM 08/08/2022    1:22 PM  Fall Risk   Falls in the past year? 1 1 0 0 0  Number falls in past yr: 0 0 0 0 0  Injury with Fall? 0 1 0 0 0  Risk for fall due to :  Impaired balance/gait No Fall Risks    Follow up Falls evaluation completed  Education provided;Falls prevention discussed      MEDICARE RISK AT HOME: Medicare Risk at Home Any stairs in or around the home?: Yes If so, are there any without handrails?: No Home free of loose throw rugs in walkways, pet beds, electrical cords, etc?: Yes Adequate lighting in your home to reduce risk of falls?: Yes Life alert?: No Use of a cane, walker or w/c?: No Grab bars in the bathroom?: Yes Shower chair or bench in shower?: No Elevated toilet seat or a handicapped toilet?: Yes  TIMED UP AND GO:  Was the test performed?  Yes  Length of time to ambulate 10 feet: 5 sec Gait steady and fast without use of assistive device  Cognitive Function:        09/24/2023   10:46 AM  6CIT Screen  What Year? 0 points  What month? 0 points  What time? 0 points  Count back from 20 0 points  Months in reverse 4 points  Repeat phrase 4 points  Total Score 8 points    Immunizations Immunization History   Administered Date(s) Administered   Influenza, Seasonal, Injecte, Preservative Fre 05/05/2023   Influenza,inj,Quad PF,6+ Mos 03/20/2022   Influenza-Unspecified 11/10/2020    TDAP status: Due, Education has been provided regarding the importance of this vaccine. Advised may receive this vaccine at local pharmacy or Health Dept. Aware to provide a copy of the vaccination record if obtained from local pharmacy or Health Dept. Verbalized acceptance and understanding.  Flu Vaccine status: Up to date  Pneumococcal vaccine status: Declined,  Education has been provided regarding the importance of this vaccine but patient still declined. Advised may receive this vaccine at local pharmacy or Health Dept. Aware to provide a copy of the vaccination record if obtained from local pharmacy or Health Dept. Verbalized acceptance and understanding.   Covid-19 vaccine status: Declined, Education has been provided regarding the importance of this vaccine but patient still declined. Advised may receive this vaccine at local pharmacy or Health Dept.or vaccine clinic. Aware to provide a copy of the vaccination record if obtained from local pharmacy or Health Dept. Verbalized acceptance and understanding.  Qualifies for Shingles Vaccine? Yes   Zostavax completed No   Shingrix Completed?: No.    Education has been provided regarding the importance of this vaccine. Patient has been advised to call insurance company to determine out of pocket expense if they have not yet received this vaccine. Advised may also receive vaccine at local pharmacy or Health Dept. Verbalized acceptance and understanding.  Screening Tests Health Maintenance  Topic Date Due   COVID-19 Vaccine (1) Never done   Zoster Vaccines- Shingrix (1 of 2) Never done   Colonoscopy  Never done   DTaP/Tdap/Td (1 - Tdap) 05/04/2024 (Originally 05/15/1979)   INFLUENZA VACCINE  10/31/2023   Medicare Annual Wellness (AWV)  09/23/2024   Hepatitis C  Screening  Completed   HIV Screening  Completed   Hepatitis B Vaccines  Aged Out   HPV VACCINES  Aged Out   Meningococcal B Vaccine  Aged Out    Health Maintenance  Health Maintenance Due  Topic Date Due   COVID-19 Vaccine (1) Never done   Zoster Vaccines- Shingrix (1 of 2) Never done   Colonoscopy  Never done    Colorectal cancer screening: Referral to GI placed cologuard. Pt aware the office will call re: appt.  Lung Cancer Screening: (Low Dose CT Chest recommended if Age 60-80 years, 20 pack-year currently smoking OR have quit w/in 15years.) does not qualify.   Lung Cancer Screening Referral: na  Additional Screening:  Hepatitis C Screening: does not qualify; Completed 08/08/2022  Vision Screening: Recommended annual ophthalmology exams for early detection of glaucoma and other disorders of the eye. Is the patient up to date with their annual eye exam?  Yes  Who is the provider or what is the name of the office in which the patient attends annual eye exams? [facility] If pt is not established with a provider, would they like to be referred to a provider to establish care? No .   Dental Screening: Recommended annual dental exams for proper oral hygiene   Community Resource Referral / Chronic Care Management: CRR  required this visit?  No   CCM required this visit?  No      Plan:     I have personally reviewed and noted the following in the patient's chart:   Medical and social history Use of alcohol, tobacco or illicit drugs  Current medications and supplements including opioid prescriptions. Patient is not currently taking opioid prescriptions. Functional ability and status Nutritional status Physical activity Advanced directives List of other physicians Hospitalizations, surgeries, and ER visits in previous 12 months Vitals Screenings to include cognitive, depression, and falls Referrals and appointments  In addition, I have reviewed and discussed with  patient certain preventive protocols, quality metrics, and best practice recommendations. A written personalized care plan for preventive services as well as general preventive health recommendations were provided to patient.     Mliss JULIANNA Spray, FNP   09/24/2023   After Visit Summary: (In Person-Declined) Patient declined AVS at this time.  Nurse Notes: none

## 2023-09-29 ENCOUNTER — Encounter: Admitting: Nurse Practitioner

## 2023-11-05 ENCOUNTER — Ambulatory Visit: Payer: 59 | Admitting: Nurse Practitioner

## 2023-11-21 ENCOUNTER — Telehealth: Payer: Self-pay

## 2023-11-21 NOTE — Progress Notes (Signed)
 Pharmacy Quality Measure Review  This patient is appearing on a report for being at risk of failing the adherence measure for diabetes medications this calendar year.   Medication: pioglitazone  Last fill date: 11/04/23 for 28 day supply  Insurance report was not up to date. No action needed at this time.    Tyrone Little, PharmD Clinical Pharmacist [facility] [phone]

## 2023-11-21 NOTE — Telephone Encounter (Signed)
 Told to contact GI

## 2023-11-21 NOTE — Telephone Encounter (Signed)
 Spoke Lin and advised to contact GI specialist.

## 2023-11-21 NOTE — Telephone Encounter (Signed)
 Copied from CRM #[id number]. Topic: Clinical - Medication Question >> Nov 21, 2023 12:16 PM Tobias CROME wrote: Reason for CRM: Cadeema with [facility] calling to get clarification on patient's medications. They are showing patient is on miralax  and metamusal powder, wanting to know which medication he should be on.   Requesting callback: [phone]

## 2023-11-21 NOTE — Telephone Encounter (Signed)
 Pt does not have neither of those meds on current med list.

## 2023-11-25 ENCOUNTER — Other Ambulatory Visit: Payer: Self-pay | Admitting: Nurse Practitioner

## 2023-11-25 DIAGNOSIS — E785 Hyperlipidemia, unspecified: Secondary | ICD-10-CM

## 2023-11-27 NOTE — Telephone Encounter (Signed)
 Requested Prescriptions  Pending Prescriptions Disp Refills   lubiprostone  (AMITIZA ) 24 MCG capsule [Pharmacy Med Name: Lubiprostone  24 MCG Capsule] 180 capsule 10    Sig: TAKE 1 CAPSULE BY MOUTH TWICE DAILY WITH MEALS     Gastroenterology: Irritable Bowel Syndrome - lubiprostone  Passed - 11/27/2023 10:28 AM      Passed - AST in normal range and within 360 days    AST  Date Value Ref Range Status  05/05/2023 23 10 - 35 U/L Final         Passed - ALT in normal range and within 360 days    ALT  Date Value Ref Range Status  05/05/2023 17 9 - 46 U/L Final         Passed - Valid encounter within last 12 months    Recent Outpatient Visits           2 months ago Encounter for [employer] annual wellness exam   [facility] Gareth Mliss FALCON, FNP   6 months ago Chronic constipation with overflow incontinence   [facility] Gareth Mliss FALCON, FNP       Future Appointments             In 1 month Gareth, Mliss FALCON, FNP [facility] [place]             simvastatin  (ZOCOR ) 40 MG tablet [Pharmacy Med Name: Simvastatin  40 MG Tablet] 90 tablet 1    Sig: TAKE 1 TABLET BY MOUTH EVERY NIGHT AT BEDTIME     Cardiovascular:  Antilipid - Statins Failed - 11/27/2023 10:28 AM      Failed - Lipid Panel in normal range within the last 12 months    Cholesterol  Date Value Ref Range Status  05/05/2023 128 <200 mg/dL Final   LDL Cholesterol (Calc)  Date Value Ref Range Status  05/05/2023 64 mg/dL (calc) Final    Comment:    Reference range: <100 . Desirable range <100 mg/dL for primary prevention;   <70 mg/dL for patients with CHD or diabetic patients  with > or = 2 CHD risk factors. SABRA LDL-C is now calculated using the Martin-Hopkins  calculation, which is a validated novel method providing  better accuracy than the Friedewald equation in the  estimation of LDL-C.  Gladis APPLETHWAITE et al. SANDREA. 7986;689(80): 2061-2068   (http://education.[facility].com/faq/FAQ164)    HDL  Date Value Ref Range Status  05/05/2023 42 > OR = 40 mg/dL Final   Triglycerides  Date Value Ref Range Status  05/05/2023 141 <150 mg/dL Final         Passed - Patient is not pregnant      Passed - Valid encounter within last 12 months    Recent Outpatient Visits           2 months ago Encounter for [employer] annual wellness exam   [facility] Gareth Mliss FALCON, FNP   6 months ago Chronic constipation with overflow incontinence   [facility] Gareth Mliss FALCON, FNP       Future Appointments             In 1 month Gareth, Mliss FALCON, FNP [facility], [place]

## 2023-12-05 NOTE — Progress Notes (Signed)
 Pharmacy Quality Measure Review  This patient is appearing on a report for being at risk of failing the adherence measure for cholesterol (statin) medications this calendar year.   Medication: simvastatin  40 mg Last fill date: 11/28/23 for 28 day supply  Insurance report was not up to date. No action needed at this time.   Shyloh Derosa E. Marsh, PharmD Clinical Pharmacist [facility] [phone]

## 2023-12-23 ENCOUNTER — Other Ambulatory Visit: Payer: Self-pay | Admitting: Nurse Practitioner

## 2023-12-23 DIAGNOSIS — N400 Enlarged prostate without lower urinary tract symptoms: Secondary | ICD-10-CM

## 2023-12-24 ENCOUNTER — Telehealth: Payer: Self-pay

## 2023-12-24 DIAGNOSIS — N400 Enlarged prostate without lower urinary tract symptoms: Secondary | ICD-10-CM

## 2023-12-24 MED ORDER — TAMSULOSIN HCL 0.4 MG PO CAPS: ORAL_CAPSULE | ORAL | 2 refills | Status: DC

## 2023-12-24 NOTE — Telephone Encounter (Signed)
 Duplicate request, refilled 12/24/23.  Requested Prescriptions  Pending Prescriptions Disp Refills   tamsulosin  (FLOMAX ) 0.4 MG CAPS capsule [Pharmacy Med Name: Tamsulosin  HCl 0.4 MG Capsule] 7 capsule 10    Sig: TAKE 1 CAPSULE BY MOUTH ONCE DAILY  *DO NOT CRUSH OR CHEW* **DO NOT OPEN CAPSULE**     Urology: Alpha-Adrenergic Blocker Failed - 12/24/2023  3:53 PM      Failed - PSA in normal range and within 360 days    No results found for: LABPSA, PSA, PSA1, ULTRAPSA       Passed - Last BP in normal range    BP Readings from Last 1 Encounters:  09/24/23 116/72         Passed - Valid encounter within last 12 months    Recent Outpatient Visits           3 months ago Encounter for [employer] annual wellness exam   [facility] Gareth Mliss FALCON, FNP   7 months ago Chronic constipation with overflow incontinence   [facility] Gareth Mliss FALCON, FNP       Future Appointments             In 5 days Gareth, Mliss FALCON, FNP [facility], [place]

## 2023-12-24 NOTE — Telephone Encounter (Signed)
 refill

## 2023-12-29 ENCOUNTER — Ambulatory Visit (INDEPENDENT_AMBULATORY_CARE_PROVIDER_SITE_OTHER): Admitting: Nurse Practitioner

## 2023-12-29 ENCOUNTER — Encounter: Payer: Self-pay | Admitting: Nurse Practitioner

## 2023-12-29 VITALS — BP 120/74 | HR 77 | Resp 16 | Ht 71.0 in | Wt 236.0 lb

## 2023-12-29 DIAGNOSIS — R569 Unspecified convulsions: Secondary | ICD-10-CM

## 2023-12-29 DIAGNOSIS — K581 Irritable bowel syndrome with constipation: Secondary | ICD-10-CM | POA: Diagnosis not present

## 2023-12-29 DIAGNOSIS — E559 Vitamin D deficiency, unspecified: Secondary | ICD-10-CM

## 2023-12-29 DIAGNOSIS — N400 Enlarged prostate without lower urinary tract symptoms: Secondary | ICD-10-CM

## 2023-12-29 DIAGNOSIS — Z130 Encounter for screening for diseases of the blood and blood-forming organs and certain disorders involving the immune mechanism: Secondary | ICD-10-CM

## 2023-12-29 DIAGNOSIS — Z1211 Encounter for screening for malignant neoplasm of colon: Secondary | ICD-10-CM

## 2023-12-29 DIAGNOSIS — K5909 Other constipation: Secondary | ICD-10-CM | POA: Diagnosis not present

## 2023-12-29 DIAGNOSIS — G4700 Insomnia, unspecified: Secondary | ICD-10-CM

## 2023-12-29 DIAGNOSIS — E66812 Obesity, class 2: Secondary | ICD-10-CM

## 2023-12-29 DIAGNOSIS — Z131 Encounter for screening for diabetes mellitus: Secondary | ICD-10-CM

## 2023-12-29 DIAGNOSIS — E785 Hyperlipidemia, unspecified: Secondary | ICD-10-CM

## 2023-12-29 DIAGNOSIS — F259 Schizoaffective disorder, unspecified: Secondary | ICD-10-CM | POA: Diagnosis not present

## 2023-12-29 DIAGNOSIS — Z6835 Body mass index (BMI) 35.0-35.9, adult: Secondary | ICD-10-CM

## 2023-12-29 NOTE — Assessment & Plan Note (Signed)
 Takes Simvastatin  daily. Lipid panel ordered.

## 2023-12-29 NOTE — Progress Notes (Signed)
 BP 120/74   Pulse 77   Resp 16   Ht 5' 11 (1.803 m)   Wt 236 lb (107 kg)   SpO2 100%   BMI 32.92 kg/m    Subjective:    Patient ID: Tyrone Little, male    DOB: October 30, 1960, 63 y.o.   MRN: [id number]  HPI: Tyrone Little is a 63 y.o. male  Chief Complaint  Patient presents with   Medical Management of Chronic Issues   Insomnia   Here with care give from [facility]  BPH: -Medications: Flomax  0.4 mg daily -Patient is compliant with above medications and reports no side effects.  -Endorses urinary incontinence. Caregiver reports that some episodes are due to the patient not wanting to get up use the bathroom.  Chronic constipation/IBS: -Medications: Lubiprostone  24 mcg BID -Patient is compliant with above medications and reports no side effects.  -Per caregiver, the patient has a bowel movement daily.  HLD: -Medications: Simvastatin  40 mg daily -Patient is compliant with above medications and reports no side effects.  -Last lipid panel: 05/05/2023, will check lipid panel during this visit.  Schizoaffective disorder: -Medications: Risperidone  2 mg BID, Fluphenazine  5 mg daily -Reports that his mood is stable. -Followed by neurology and psychiatry.  Seizures: -Medications: Caregiver reports that the patient has not been taking Divalproex  125 mg. Prescribed by neurology. Needs to follow-up with neurologist for prescription.  -Denies any recent seizure activity.  Insomnia: -Patient reports having insomnia. Wakes up frequently at night and gets up every 2 hours. -Needs to follow-up with psychiatry.  Obesity: Filed Weights   12/29/23 1043  Weight: 236 lb (107 kg)   Body mass index is 32.92 kg/m.  Patient lives in group home has no control over diet.  Recommend continuing to work on lifestyle modification including eating in a calorie deficit and getting in physical activity.    Hx of vitamin d  deficiency Will recheck labs .    09/24/2023   10:46 AM 09/24/2023   10:45  AM 05/05/2023    2:36 PM  Depression screen PHQ 2/9  Decreased Interest 0 1 0  Down, Depressed, Hopeless 1  0  PHQ - 2 Score 1 1 0  Altered sleeping 0  0  Tired, decreased energy 0  0  Change in appetite 0  0  Feeling bad or failure about yourself  0  0  Trouble concentrating 1  0  Moving slowly or fidgety/restless 0  0  Suicidal thoughts 0  0  PHQ-9 Score 2  0  Difficult doing work/chores   Not difficult at all         09/24/2023   10:46 AM 08/08/2022    1:22 PM  GAD 7 : Generalized Anxiety Score  Nervous, Anxious, on Edge 0 0  Control/stop worrying 0 0  Worry too much - different things 0 0  Trouble relaxing 0 0  Restless 0 0  Easily annoyed or irritable 0 0  Afraid - awful might happen 0 0  Total GAD 7 Score 0 0  Anxiety Difficulty Not difficult at all Not difficult at all    Relevant past medical, surgical, family and social history reviewed and updated as indicated. Interim medical history since our last visit reviewed. Allergies and medications reviewed and updated.  Review of Systems  Constitutional: Negative for fever or weight change.  Respiratory: Negative for cough and shortness of breath.   Cardiovascular: Negative for chest pain or palpitations.  Gastrointestinal: Negative for abdominal pain, no  bowel changes.  Musculoskeletal: Negative for gait problem or joint swelling.  Skin: Negative for rash.  Neurological: Negative for dizziness or headache.  No other specific complaints in a complete review of systems (except as listed in HPI above).     Objective:     BP 120/74   Pulse 77   Resp 16   Ht 5' 11 (1.803 m)   Wt 236 lb (107 kg)   SpO2 100%   BMI 32.92 kg/m    Wt Readings from Last 3 Encounters:  12/29/23 236 lb (107 kg)  09/24/23 230 lb 6.4 oz (104.5 kg)  05/05/23 221 lb (100.2 kg)    Physical Exam Constitutional:      Appearance: Normal appearance.  Cardiovascular:     Heart sounds: Normal heart sounds.  Pulmonary:     Effort:  Pulmonary effort is normal.     Breath sounds: Normal breath sounds.  Neurological:     Mental Status: He is alert and oriented to person, place, and time. Mental status is at baseline.  Psychiatric:        Mood and Affect: Mood normal.        Behavior: Behavior normal.        Thought Content: Thought content normal.      Results for orders placed or performed in visit on 05/05/23  CBC with Differential/Platelet   Collection Time: 05/05/23  1:53 PM  Result Value Ref Range   WBC 3.9 3.8 - 10.8 Thousand/uL   RBC 5.13 4.20 - 5.80 Million/uL   Hemoglobin 14.8 13.2 - 17.1 g/dL   HCT 56.2 61.4 - 49.9 %   MCV 85.2 80.0 - 100.0 fL   MCH 28.8 27.0 - 33.0 pg   MCHC 33.9 32.0 - 36.0 g/dL   RDW 86.8 88.9 - 84.9 %   Platelets 180 140 - 400 Thousand/uL   MPV 10.7 7.5 - 12.5 fL   Neutro Abs 2,094 1,500 - 7,800 cells/uL   Absolute Lymphocytes 1,447 850 - 3,900 cells/uL   Absolute Monocytes 328 200 - 950 cells/uL   Eosinophils Absolute 12 (L) 15 - 500 cells/uL   Basophils Absolute 20 0 - 200 cells/uL   Neutrophils Relative % 53.7 %   Total Lymphocyte 37.1 %   Monocytes Relative 8.4 %   Eosinophils Relative 0.3 %   Basophils Relative 0.5 %  COMPLETE METABOLIC PANEL WITH GFR   Collection Time: 05/05/23  1:53 PM  Result Value Ref Range   Glucose, Bld 134 (H) 65 - 99 mg/dL   BUN 26 (H) 7 - 25 mg/dL   Creat 8.79 9.29 - 8.64 mg/dL   eGFR 69 > OR = 60 fO/fpw/8.26f7   BUN/Creatinine Ratio 22 6 - 22 (calc)   Sodium 143 135 - 146 mmol/L   Potassium 4.4 3.5 - 5.3 mmol/L   Chloride 103 98 - 110 mmol/L   CO2 30 20 - 32 mmol/L   Calcium 9.2 8.6 - 10.3 mg/dL   Total Protein 6.5 6.1 - 8.1 g/dL   Albumin 3.8 3.6 - 5.1 g/dL   Globulin 2.7 1.9 - 3.7 g/dL (calc)   AG Ratio 1.4 1.0 - 2.5 (calc)   Total Bilirubin 0.3 0.2 - 1.2 mg/dL   Alkaline phosphatase (APISO) 56 35 - 144 U/L   AST 23 10 - 35 U/L   ALT 17 9 - 46 U/L  Lipid panel   Collection Time: 05/05/23  1:53 PM  Result Value Ref Range    Cholesterol 128 <  200 mg/dL   HDL 42 > OR = 40 mg/dL   Triglycerides 858 <849 mg/dL   LDL Cholesterol (Calc) 64 mg/dL (calc)   Total CHOL/HDL Ratio 3.0 <5.0 (calc)   Non-HDL Cholesterol (Calc) 86 <869 mg/dL (calc)  Hemoglobin J8r   Collection Time: 05/05/23  1:53 PM  Result Value Ref Range   Hgb A1c MFr Bld 5.6 <5.7 % of total Hgb   Mean Plasma Glucose 114 mg/dL   eAG (mmol/L) 6.3 mmol/L          Assessment & Plan:   Problem List Items Addressed This Visit       Digestive   Chronic constipation with overflow incontinence   Takes Flomax  and Lubiprostine. Reports daily bowel movement. Endorses some urinary incontinence episodes.      Irritable bowel syndrome with constipation   Continues to take lubiprostine,  no changes        Genitourinary   Benign prostatic hyperplasia without lower urinary tract symptoms - Primary   Takes Flomax  daily. Endorses some urinary incontinence episodes. PSA ordered.      Relevant Orders   PSA     Other   Hyperlipidemia   Takes Simvastatin  daily. Lipid panel ordered.      Relevant Orders   Lipid panel   Obesity   Patient lives in group home has no control over diet.  Recommend continuing to work on lifestyle modification including eating in a calorie deficit and getting in physical activity.       Schizoaffective disorder (HCC)   Takes Risperidone  and Fluphenazine . Followed by psychiatry.      Vitamin D  deficiency   Will recheck labs .      Relevant Orders   Vitamin D  (25 hydroxy)   Seizure-like activity (HCC)   Followed by neurology. Depakote  previously ordered by neurology, but not currently taking. Needs to follow-up with neurology. Reports no seizure activity.      Other Visit Diagnoses       Screening for diabetes mellitus       HbA1c and CMP ordered.   Relevant Orders   Comprehensive metabolic panel with GFR   Hemoglobin A1c     Screening for deficiency anemia       Labs ordered.   Relevant Orders   CBC  with Differential/Platelet   B12 and Folate Panel     Screening for colon cancer       PSA ordered.   Relevant Orders   PSA     Insomnia, unspecified type       Reports difficulty staying asleep at night. Wakes up every 2 hours. Needs to follow-up with psychiatry.            Follow up plan: Return in about 6 months (around 06/27/2024) for follow up.    I have reviewed this encounter including the documentation in this note and/or discussed this patient with the provider, Alexa Everhart SNP, I am certifying that I agree with the content of this note as supervising/preceptor nurse practitioner.  Mliss Spray, FNP-C [facility] 12/29/2023, 11:39 AM

## 2023-12-29 NOTE — Assessment & Plan Note (Signed)
 Patient lives in group home has no control over diet.  Recommend continuing to work on lifestyle modification including eating in a calorie deficit and getting in physical activity.

## 2023-12-29 NOTE — Assessment & Plan Note (Signed)
 Continues to take lubiprostine,  no changes

## 2023-12-29 NOTE — Assessment & Plan Note (Signed)
 Takes Risperidone  and Fluphenazine . Followed by psychiatry.

## 2023-12-29 NOTE — Assessment & Plan Note (Signed)
 Takes Flomax  and Lubiprostine. Reports daily bowel movement. Endorses some urinary incontinence episodes.

## 2023-12-29 NOTE — Assessment & Plan Note (Signed)
 Takes Flomax  daily. Endorses some urinary incontinence episodes. PSA ordered.

## 2023-12-29 NOTE — Assessment & Plan Note (Signed)
 Will recheck labs

## 2023-12-29 NOTE — Assessment & Plan Note (Signed)
 Followed by neurology. Depakote  previously ordered by neurology, but not currently taking. Needs to follow-up with neurology. Reports no seizure activity.

## 2023-12-30 ENCOUNTER — Ambulatory Visit: Payer: Self-pay | Admitting: Nurse Practitioner

## 2023-12-30 LAB — HEMOGLOBIN A1C
Hgb A1c MFr Bld: 5.5 % (ref <5.7)
Mean Plasma Glucose: 111 mg/dL
eAG (mmol/L): 6.2 mmol/L

## 2023-12-30 LAB — CBC WITH DIFFERENTIAL/PLATELET
Absolute Lymphocytes: 1790 {cells}/uL (ref 850–3900)
Absolute Monocytes: 388 {cells}/uL (ref 200–950)
Basophils Absolute: 29 {cells}/uL (ref 0–200)
Basophils Relative: 0.5 %
Eosinophils Absolute: 80 {cells}/uL (ref 15–500)
Eosinophils Relative: 1.4 %
HCT: 43.6 % (ref 38.5–50.0)
Hemoglobin: 14.2 g/dL (ref 13.2–17.1)
MCH: 28.9 pg (ref 27.0–33.0)
MCHC: 32.6 g/dL (ref 32.0–36.0)
MCV: 88.8 fL (ref 80.0–100.0)
MPV: 10.5 fL (ref 7.5–12.5)
Monocytes Relative: 6.8 %
Neutro Abs: 3414 {cells}/uL (ref 1500–7800)
Neutrophils Relative %: 59.9 %
Platelets: 240 Thousand/uL (ref 140–400)
RBC: 4.91 Million/uL (ref 4.20–5.80)
RDW: 13.3 % (ref 11.0–15.0)
Total Lymphocyte: 31.4 %
WBC: 5.7 Thousand/uL (ref 3.8–10.8)

## 2023-12-30 LAB — COMPREHENSIVE METABOLIC PANEL WITH GFR
AG Ratio: 1.6 (calc) (ref 1.0–2.5)
ALT: 10 U/L (ref 9–46)
AST: 11 U/L (ref 10–35)
Albumin: 3.9 g/dL (ref 3.6–5.1)
Alkaline phosphatase (APISO): 53 U/L (ref 35–144)
BUN: 22 mg/dL (ref 7–25)
CO2: 32 mmol/L (ref 20–32)
Calcium: 9.2 mg/dL (ref 8.6–10.3)
Chloride: 103 mmol/L (ref 98–110)
Creat: 1.17 mg/dL (ref 0.70–1.35)
Globulin: 2.4 g/dL (ref 1.9–3.7)
Glucose, Bld: 83 mg/dL (ref 65–99)
Potassium: 4.7 mmol/L (ref 3.5–5.3)
Sodium: 142 mmol/L (ref 135–146)
Total Bilirubin: 0.4 mg/dL (ref 0.2–1.2)
Total Protein: 6.3 g/dL (ref 6.1–8.1)
eGFR: 71 mL/min/1.73m2 (ref >=60)

## 2023-12-30 LAB — LIPID PANEL
Cholesterol: 123 mg/dL (ref <200)
HDL: 47 mg/dL (ref >=40)
LDL Cholesterol (Calc): 59 mg/dL
Non-HDL Cholesterol (Calc): 76 mg/dL (ref <130)
Total CHOL/HDL Ratio: 2.6 (calc) (ref <5.0)
Triglycerides: 86 mg/dL (ref <150)

## 2023-12-30 LAB — B12 AND FOLATE PANEL
Folate: 18.8 ng/mL
Vitamin B-12: 545 pg/mL (ref 200–1100)

## 2023-12-30 LAB — PSA: PSA: 0.51 ng/mL (ref <=4.00)

## 2023-12-30 LAB — VITAMIN D 25 HYDROXY (VIT D DEFICIENCY, FRACTURES): Vit D, 25-Hydroxy: 77 ng/mL (ref 30–100)

## 2024-01-14 NOTE — Progress Notes (Signed)
 Tyrone Little                                          MRN: [id number]   01/14/2024   The [facility]BCI Quality Team Specialist reviewed this patient medical record for the purposes of chart review for care gap closure. The following were reviewed: chart review for care gap closure-kidney health evaluation for diabetes:eGFR  and uACR.    VBCI Quality Team

## 2024-01-22 ENCOUNTER — Other Ambulatory Visit: Payer: Self-pay | Admitting: Nurse Practitioner

## 2024-01-22 DIAGNOSIS — E559 Vitamin D deficiency, unspecified: Secondary | ICD-10-CM

## 2024-01-23 NOTE — Telephone Encounter (Signed)
 Requested Prescriptions  Pending Prescriptions Disp Refills   Cholecalciferol (VITAMIN D3) 125 MCG (5000 UT) CAPS [Pharmacy Med Name: Vitamin D3 125 MCG (5000 UT) Capsule] 90 capsule 3    Sig: @ TAKE 1 CAPSULE BY MOUTH ONCE DAILY *TAKE WITH FOOD*     Endocrinology:  Vitamins - Vitamin D  Supplementation 2 Failed - 01/23/2024 10:32 PM      Failed - Manual Review: Route requests for 50,000 IU strength to the provider      Passed - Ca in normal range and within 360 days    Calcium  Date Value Ref Range Status  12/29/2023 9.2 8.6 - 10.3 mg/dL Final   Calcium, Ion  Date Value Ref Range Status  08/02/2021 1.24 1.15 - 1.40 mmol/L Final         Passed - Vitamin D  in normal range and within 360 days    Vit D, 25-Hydroxy  Date Value Ref Range Status  12/29/2023 77 30 - 100 ng/mL Final    Comment:    Vitamin D  Status         25-OH Vitamin D : . Deficiency:                    <20 ng/mL Insufficiency:             20 - 29 ng/mL Optimal:                 > or = 30 ng/mL . For 25-OH Vitamin D  testing on patients on  D2-supplementation and patients for whom quantitation  of D2 and D3 fractions is required, the Quest[employer]ureD(TM) 25-OH VIT D, (D2,D3), LC/MS/MS is recommended: order  code 07111 (patients >40yrs). . See Note 1 . Note 1 . For additional information, please refer to  http://education.Quest[facility].com/faq/FAQ199  (This link is being provided for informational/ educational purposes only.)          Passed - Valid encounter within last 12 months    Recent Outpatient Visits           3 weeks ago Benign prostatic hyperplasia without lower urinary tract symptoms   [facility] Gareth Mliss FALCON, FNP   4 months ago Encounter for [employer] annual wellness exam   [facility] Gareth Mliss FALCON, FNP   8 months ago Chronic constipation with overflow incontinence   [facility] Gareth Mliss FAL[place], [place]

## 2024-02-11 ENCOUNTER — Other Ambulatory Visit: Payer: Self-pay | Admitting: Nurse Practitioner

## 2024-02-11 DIAGNOSIS — J301 Allergic rhinitis due to pollen: Secondary | ICD-10-CM

## 2024-02-13 NOTE — Telephone Encounter (Signed)
 Requested Prescriptions  Pending Prescriptions Disp Refills   pioglitazone  (ACTOS ) 15 MG tablet [Pharmacy Med Name: Pioglitazone  HCl 15 MG Tablet] 90 tablet 0    Sig: TAKE 1 TABLET BY MOUTH ONCE DAILY     Endocrinology:  Diabetes - Glitazones - pioglitazone  Passed - 02/13/2024  1:44 PM      Passed - HBA1C is between 0 and 7.9 and within 180 days    Hgb A1c MFr Bld  Date Value Ref Range Status  12/29/2023 5.5 <5.7 % Final    Comment:    For the purpose of screening for the presence of diabetes: . <5.7%       Consistent with the absence of diabetes 5.7-6.4%    Consistent with increased risk for diabetes             (prediabetes) > or =6.5%  Consistent with diabetes . This assay result is consistent with a decreased risk of diabetes. . Currently, no consensus exists regarding use of hemoglobin A1c for diagnosis of diabetes in children. . According to American Diabetes Association (ADA) guidelines, hemoglobin A1c <7.0% represents optimal control in non-pregnant diabetic patients. Different metrics may apply to specific patient populations.  Standards of Medical Care in Diabetes(ADA). SABRA Amy - Valid encounter within last 6 months    Recent Outpatient Visits           1 month ago Benign prostatic hyperplasia without lower urinary tract symptoms   [facility] Gareth Mliss FALCON, FNP   4 months ago Encounter for [employer] annual wellness exam   [facility] Gareth Mliss FALCON, FNP   9 months ago Chronic constipation with overflow incontinence   [facility] Gareth Mliss FALCON, FNP              Refused Prescriptions Disp Refills   ALLERGY RELIEF CETIRIZINE  10 MG tablet [Pharmacy Med Name: Allergy Relief Cetirizine  10 MG Tablet] 8 tablet 10    Sig: @ TAKE 1 TABLET BY MOUTH ONCE DAILY     Ear, Nose, and Throat:  Antihistamines 2 Passed - 02/13/2024  1:44 PM      Passed - Cr in normal range  and within 360 days    Creat  Date Value Ref Range Status  12/29/2023 1.17 0.70 - 1.35 mg/dL Final         Passed - Valid encounter within last 12 months    Recent Outpatient Visits           1 month ago Benign prostatic hyperplasia without lower urinary tract symptoms   [facility] Gareth Mliss FALCON, FNP   4 months ago Encounter for [employer] annual wellness exam   [facility] Gareth Mliss FALCON, FNP   9 months ago Chronic constipation with overflow incontinence   [facility] Gareth Mliss [place], [place]

## 2024-02-13 NOTE — Telephone Encounter (Signed)
 Requested Prescriptions  Pending Prescriptions Disp Refills   pioglitazone  (ACTOS ) 15 MG tablet [Pharmacy Med Name: Pioglitazone  HCl 15 MG Tablet] 8 tablet 10    Sig: TAKE 1 TABLET BY MOUTH ONCE DAILY     Endocrinology:  Diabetes - Glitazones - pioglitazone  Passed - 02/13/2024  1:42 PM      Passed - HBA1C is between 0 and 7.9 and within 180 days    Hgb A1c MFr Bld  Date Value Ref Range Status  12/29/2023 5.5 <5.7 % Final    Comment:    For the purpose of screening for the presence of diabetes: . <5.7%       Consistent with the absence of diabetes 5.7-6.4%    Consistent with increased risk for diabetes             (prediabetes) > or =6.5%  Consistent with diabetes . This assay result is consistent with a decreased risk of diabetes. . Currently, no consensus exists regarding use of hemoglobin A1c for diagnosis of diabetes in children. . According to American Diabetes Association (ADA) guidelines, hemoglobin A1c <7.0% represents optimal control in non-pregnant diabetic patients. Different metrics may apply to specific patient populations.  Standards of Medical Care in Diabetes(ADA). SABRA Amy - Valid encounter within last 6 months    Recent Outpatient Visits           1 month ago Benign prostatic hyperplasia without lower urinary tract symptoms   [facility] Gareth Mliss FALCON, FNP   4 months ago Encounter for [employer] annual wellness exam   [facility] Gareth Mliss FALCON, FNP   9 months ago Chronic constipation with overflow incontinence   [facility] Gareth Mliss F, FNP               ALLERGY RELIEF CETIRIZINE  10 MG tablet [Pharmacy Med Name: Allergy Relief Cetirizine  10 MG Tablet] 8 tablet 10    Sig: @ TAKE 1 TABLET BY MOUTH ONCE DAILY     Ear, Nose, and Throat:  Antihistamines 2 Passed - 02/13/2024  1:42 PM      Passed - Cr in normal range and within 360 days    Creat  Date  Value Ref Range Status  12/29/2023 1.17 0.70 - 1.35 mg/dL Final         Passed - Valid encounter within last 12 months    Recent Outpatient Visits           1 month ago Benign prostatic hyperplasia without lower urinary tract symptoms   [facility] Gareth Mliss FALCON, FNP   4 months ago Encounter for [employer] annual wellness exam   [facility] Gareth Mliss FALCON, FNP   9 months ago Chronic constipation with overflow incontinence   [facility] Gareth Mliss FALCON, [place]

## 2024-02-24 NOTE — Progress Notes (Signed)
 Tyrone Little                                          MRN: [id number]   02/24/2024   The VBCI Quality Team Specialist reviewed this patient medical record for the purposes of chart review for care gap closure. The following were reviewed: abstraction for care gap closure-glycemic status assessment.    VBCI Quality Team

## 2024-03-09 NOTE — Progress Notes (Signed)
 Tyrone Little                                          MRN: [id number]   03/09/2024   The VBCI Quality Team Specialist reviewed this patient medical record for the purposes of chart review for care gap closure. The following were reviewed: chart review for care gap closure-kidney health evaluation for diabetes:eGFR  and uACR.    VBCI Quality Team

## 2024-03-11 ENCOUNTER — Other Ambulatory Visit: Payer: Self-pay | Admitting: Nurse Practitioner

## 2024-03-11 DIAGNOSIS — N400 Enlarged prostate without lower urinary tract symptoms: Secondary | ICD-10-CM

## 2024-03-15 NOTE — Telephone Encounter (Signed)
 Requested Prescriptions  Pending Prescriptions Disp Refills   tamsulosin  (FLOMAX ) 0.4 MG CAPS capsule [Pharmacy Med Name: Tamsulosin  HCl 0.4 MG Capsule] 90 capsule 2    Sig: TAKE 1 CAPSULE BY MOUTH ONCE DAILY *DO NOT CRUSH OR CHEW* **DO NOT OPEN CAPSULE**     Urology: Alpha-Adrenergic Blocker Passed - 03/15/2024 11:29 AM      Passed - PSA in normal range and within 360 days    PSA  Date Value Ref Range Status  12/29/2023 0.51 < OR = 4.00 ng/mL Final    Comment:    The total PSA value from this assay system is  standardized against the WHO standard. The test  result will be approximately 20% lower when compared  to the equimolar-standardized total PSA (Beckman  Coulter). Comparison of serial PSA results should be  interpreted with this fact in mind. . This test was performed using the Siemens  chemiluminescent method. Values obtained from  different assay methods cannot be used interchangeably. PSA levels, regardless of value, should not be interpreted as absolute evidence of the presence or absence of disease.          Passed - Last BP in normal range    BP Readings from Last 1 Encounters:  12/29/23 120/74         Passed - Valid encounter within last 12 months    Recent Outpatient Visits           2 months ago Benign prostatic hyperplasia without lower urinary tract symptoms   [facility] Gareth Mliss FALCON, FNP   5 months ago Encounter for [employer] annual wellness exam   [facility] Gareth Mliss FALCON, FNP   10 months ago Chronic constipation with overflow incontinence   [facility] Gareth Mliss [place], [place]

## 2024-03-22 ENCOUNTER — Ambulatory Visit: Admitting: Nurse Practitioner

## 2024-03-22 ENCOUNTER — Telehealth: Payer: Self-pay | Admitting: Nurse Practitioner

## 2024-03-22 NOTE — Telephone Encounter (Signed)
 Patient wasn't seen in our office today due to no paperwork not recved

## 2024-03-23 ENCOUNTER — Telehealth: Payer: Self-pay | Admitting: Nurse Practitioner

## 2024-03-23 NOTE — Telephone Encounter (Unsigned)
 Copied from CRM [id number]. Topic: General - Other >> Mar 23, 2024 10:00 AM [name] wrote: Reason for CRM: Tyrone Little [phone] (patient brother located in [place], [place] ) is the patient legal guardian called about the appointment/ Patient wasn't seen in our office today due to no paperwork not recved  Asked for a call back to discuss what information needs to be listed on the letter, where to send the document and when will the next appointment be scheduled- states that this have never happened before and wants to discuss what has changed. Wants to know why they wasn't notified about the letter.  Please call back to explain at [phone] for an update

## 2024-03-26 ENCOUNTER — Telehealth: Payer: Self-pay | Admitting: Nurse Practitioner

## 2024-04-16 ENCOUNTER — Other Ambulatory Visit: Payer: Self-pay

## 2024-04-16 ENCOUNTER — Encounter: Payer: Self-pay | Admitting: Nurse Practitioner

## 2024-04-16 DIAGNOSIS — R569 Unspecified convulsions: Secondary | ICD-10-CM

## 2024-06-25 ENCOUNTER — Ambulatory Visit: Admitting: Nurse Practitioner
